# Patient Record
Sex: Male | Born: 1950 | Race: White | Hispanic: No | Smoking: Current every day smoker
Health system: Southern US, Community
[De-identification: ages and names within clinical notes are randomized; demographics above are authoritative.]

## PROBLEM LIST (undated history)

## (undated) DIAGNOSIS — E785 Hyperlipidemia, unspecified: Secondary | ICD-10-CM

## (undated) DIAGNOSIS — I214 Non-ST elevation (NSTEMI) myocardial infarction: Secondary | ICD-10-CM

## (undated) DIAGNOSIS — I2510 Atherosclerotic heart disease of native coronary artery without angina pectoris: Secondary | ICD-10-CM

---

## 2010-09-12 ENCOUNTER — Emergency Department (HOSPITAL_BASED_OUTPATIENT_CLINIC_OR_DEPARTMENT_OTHER)
Admission: EM | Admit: 2010-09-12 | Discharge: 2010-09-12 | Disposition: A | Discharge status: Home / Self Care | Payer: Worker's Compensation | Attending: Emergency Medicine | Admitting: Emergency Medicine

## 2010-09-12 ENCOUNTER — Emergency Department (INDEPENDENT_AMBULATORY_CARE_PROVIDER_SITE_OTHER): Payer: Worker's Compensation

## 2010-09-12 DIAGNOSIS — Y9289 Other specified places as the place of occurrence of the external cause: Secondary | ICD-10-CM | POA: Insufficient documentation

## 2010-09-12 DIAGNOSIS — M25579 Pain in unspecified ankle and joints of unspecified foot: Secondary | ICD-10-CM

## 2010-09-12 DIAGNOSIS — S8990XA Unspecified injury of unspecified lower leg, initial encounter: Secondary | ICD-10-CM

## 2010-09-12 DIAGNOSIS — X58XXXA Exposure to other specified factors, initial encounter: Secondary | ICD-10-CM

## 2010-09-12 DIAGNOSIS — F17200 Nicotine dependence, unspecified, uncomplicated: Secondary | ICD-10-CM | POA: Insufficient documentation

## 2010-09-12 DIAGNOSIS — S93409A Sprain of unspecified ligament of unspecified ankle, initial encounter: Secondary | ICD-10-CM | POA: Insufficient documentation

## 2010-09-12 DIAGNOSIS — R609 Edema, unspecified: Secondary | ICD-10-CM

## 2010-09-12 DIAGNOSIS — X500XXA Overexertion from strenuous movement or load, initial encounter: Secondary | ICD-10-CM | POA: Insufficient documentation

## 2013-06-19 ENCOUNTER — Emergency Department (HOSPITAL_BASED_OUTPATIENT_CLINIC_OR_DEPARTMENT_OTHER): Payer: Commercial Managed Care - PPO

## 2013-06-19 ENCOUNTER — Inpatient Hospital Stay (HOSPITAL_BASED_OUTPATIENT_CLINIC_OR_DEPARTMENT_OTHER)
Admission: EM | Admit: 2013-06-19 | Discharge: 2013-06-22 | DRG: 247 | Disposition: A | Discharge status: Home / Self Care | Payer: Commercial Managed Care - PPO | Attending: Internal Medicine | Admitting: Internal Medicine

## 2013-06-19 ENCOUNTER — Encounter (HOSPITAL_BASED_OUTPATIENT_CLINIC_OR_DEPARTMENT_OTHER): Payer: Self-pay | Admitting: Emergency Medicine

## 2013-06-19 DIAGNOSIS — E669 Obesity, unspecified: Secondary | ICD-10-CM | POA: Diagnosis present

## 2013-06-19 DIAGNOSIS — Z720 Tobacco use: Secondary | ICD-10-CM | POA: Diagnosis present

## 2013-06-19 DIAGNOSIS — Z8249 Family history of ischemic heart disease and other diseases of the circulatory system: Secondary | ICD-10-CM

## 2013-06-19 DIAGNOSIS — I214 Non-ST elevation (NSTEMI) myocardial infarction: Principal | ICD-10-CM | POA: Diagnosis present

## 2013-06-19 DIAGNOSIS — Z6831 Body mass index (BMI) 31.0-31.9, adult: Secondary | ICD-10-CM

## 2013-06-19 DIAGNOSIS — I2510 Atherosclerotic heart disease of native coronary artery without angina pectoris: Secondary | ICD-10-CM | POA: Diagnosis present

## 2013-06-19 DIAGNOSIS — E785 Hyperlipidemia, unspecified: Secondary | ICD-10-CM | POA: Diagnosis present

## 2013-06-19 DIAGNOSIS — R7309 Other abnormal glucose: Secondary | ICD-10-CM | POA: Diagnosis present

## 2013-06-19 DIAGNOSIS — R079 Chest pain, unspecified: Secondary | ICD-10-CM | POA: Diagnosis present

## 2013-06-19 DIAGNOSIS — F17200 Nicotine dependence, unspecified, uncomplicated: Secondary | ICD-10-CM | POA: Diagnosis present

## 2013-06-19 HISTORY — DX: Atherosclerotic heart disease of native coronary artery without angina pectoris: I25.10

## 2013-06-19 HISTORY — DX: Hyperlipidemia, unspecified: E78.5

## 2013-06-19 HISTORY — DX: Non-ST elevation (NSTEMI) myocardial infarction: I21.4

## 2013-06-19 LAB — COMPREHENSIVE METABOLIC PANEL WITH GFR
AST: 18 U/L (ref 0–37)
CO2: 25 meq/L (ref 19–32)
Calcium: 9.6 mg/dL (ref 8.4–10.5)
Creatinine, Ser: 1.1 mg/dL (ref 0.50–1.35)
GFR calc Af Amer: 82 mL/min — ABNORMAL LOW (ref >90)
GFR calc non Af Amer: 71 mL/min — ABNORMAL LOW (ref >90)
Glucose, Bld: 114 mg/dL — ABNORMAL HIGH (ref 70–99)

## 2013-06-19 LAB — COMPREHENSIVE METABOLIC PANEL
ALT: 22 U/L (ref 0–53)
Albumin: 3.8 g/dL (ref 3.5–5.2)
Alkaline Phosphatase: 88 U/L (ref 39–117)
BUN: 14 mg/dL (ref 6–23)
Chloride: 101 mEq/L (ref 96–112)
Potassium: 3.8 mEq/L (ref 3.7–5.3)
Sodium: 140 mEq/L (ref 137–147)
Total Bilirubin: 0.3 mg/dL (ref 0.3–1.2)
Total Protein: 7.6 g/dL (ref 6.0–8.3)

## 2013-06-19 LAB — CBC
HCT: 43.8 % (ref 39.0–52.0)
HEMATOCRIT: 44.1 % (ref 39.0–52.0)
Hemoglobin: 15.1 g/dL (ref 13.0–17.0)
Hemoglobin: 15.4 g/dL (ref 13.0–17.0)
MCH: 33.3 pg (ref 26.0–34.0)
MCH: 33.3 pg (ref 26.0–34.0)
MCHC: 34.5 g/dL (ref 30.0–36.0)
MCHC: 34.9 g/dL (ref 30.0–36.0)
MCV: 96.5 fL (ref 78.0–100.0)
MCV: 95.5 fL (ref 78.0–100.0)
Platelets: 212 K/uL (ref 150–400)
Platelets: 208 10*3/uL (ref 150–400)
RBC: 4.54 MIL/uL (ref 4.22–5.81)
RBC: 4.62 MIL/uL (ref 4.22–5.81)
RDW: 13.4 % (ref 11.5–15.5)
RDW: 13.6 % (ref 11.5–15.5)
WBC: 7.6 10*3/uL (ref 4.0–10.5)
WBC: 7.8 10*3/uL (ref 4.0–10.5)

## 2013-06-19 LAB — CREATININE, SERUM: Creatinine, Ser: 0.88 mg/dL (ref 0.50–1.35)

## 2013-06-19 LAB — TROPONIN I
Troponin I: <0.3 ng/mL (ref <0.30)
Troponin I: 0.79 ng/mL (ref <0.30)

## 2013-06-19 LAB — PRO B NATRIURETIC PEPTIDE: PRO B NATRI PEPTIDE: 15.8 pg/mL (ref 0–125)

## 2013-06-19 MED ORDER — ACETAMINOPHEN 325 MG PO TABS
650.0000 mg | ORAL_TABLET | ORAL | Status: DC | PRN
  Administered 2013-06-19 – 2013-06-21 (×4): 650 mg via ORAL
  Filled 2013-06-19 (×3): qty 2

## 2013-06-19 MED ORDER — ENOXAPARIN SODIUM 40 MG/0.4ML ~~LOC~~ SOLN
40.0000 mg | SUBCUTANEOUS | Status: DC
  Administered 2013-06-19: 40 mg via SUBCUTANEOUS
  Filled 2013-06-19 (×2): qty 0.4

## 2013-06-19 MED ORDER — ASPIRIN 81 MG PO CHEW
324.0000 mg | CHEWABLE_TABLET | Freq: Once | ORAL | Status: AC
  Administered 2013-06-19: 324 mg via ORAL
  Filled 2013-06-19: qty 4

## 2013-06-19 MED ORDER — ONDANSETRON HCL 4 MG/2ML IJ SOLN
4.0000 mg | Freq: Four times a day (QID) | INTRAMUSCULAR | Status: DC | PRN
Start: 2013-06-19 — End: 2013-06-22

## 2013-06-19 MED ORDER — EXERCISE FOR HEART AND HEALTH BOOK
Freq: Once | Status: AC
  Administered 2013-06-20: 12:00:00
  Filled 2013-06-19 (×2): qty 1

## 2013-06-19 MED ORDER — NITROGLYCERIN 0.4 MG SL SUBL
0.4000 mg | SUBLINGUAL_TABLET | SUBLINGUAL | Status: DC | PRN
  Administered 2013-06-19: 0.4 mg via SUBLINGUAL
  Filled 2013-06-19: qty 25
  Filled 2013-06-19: qty 1

## 2013-06-19 MED ORDER — MORPHINE SULFATE 2 MG/ML IJ SOLN: 2.0000 mg | INTRAMUSCULAR | Status: DC | PRN

## 2013-06-19 MED ORDER — ACTIVE PARTNERSHIP FOR HEALTH OF YOUR HEART BOOK
Freq: Once | Status: AC
  Administered 2013-06-20: 06:00:00
  Filled 2013-06-19: qty 1

## 2013-06-19 NOTE — ED Notes (Signed)
Onset of midsternal sharp chest pain at 1230 today that radiated down his left arm.  Was working under a trailer and when he crawled out from under it the pain began.  Chest pain has resolved but discomfort remains in his left arm.  States his left arm is 'aching.'  Denies SHOB, nausea, diaphoresis.

## 2013-06-19 NOTE — Progress Notes (Signed)
Called by Dr. Oletta Lamas, for transfer and admission of this 63 year old male with a history of untreated hypercholesterolemia, tobacco abuse, family history of CAD who presented to med center [place] with chest pain that radiated down his left arm. Chest pain has now completely resolved. EKG and initial troponins are negative. Vitals, labs stable.Patient has been accepted as observation to a telemetry unit, will need further orders and possible risk stratification while inpatient.

## 2013-06-19 NOTE — H&P (Signed)
[facility] History and Physical  Mark Potts IPJ:[id number] DOB: 12/06/1950 DOA: 06/19/2013  Referring physician: Quita Potts, ER physician PCP: Patient has no primary care provider  Chief Complaint: Chest discomfort  HPI: Mark Potts is a 63 y.o. male  With past medical history of hyperlipidemia and tobacco abuse who does not follow with a physician normally and was today working underneath history or when he started experiencing some chest discomfort. It was described as sharp and just left of midsternal radiating down his left arm to his elbow. He did not go anywhere else. It was described as continuous. No associated shortness of breath, diaphoresis, and nausea or lightheadedness. No worsening with deep inspiration. Patient came to the emergency room and already the pain started to diminish. After about 30 minutes in the emergency room, the pain had completely left his chest and is present as a nagging sensation almost completely in his left elbow.  In the emergency room, lab work was unremarkable. Cardiac enzymes were normal. Chest x-ray showed questionable cardiomegaly with mild pulmonary vascular congestion. On admission, patient was noted to have elevated blood pressures and mild tachycardia with medication given for pain, this resolved. Hospitalists were called for evaluation and patient was transferred to [facility] admission   Review of Systems:  Patient seen after arrival to floor. Doing okay. Complains of some residual ache in his left elbow. Denies any headaches, vision changes, dysphagia, chest pain, palpitations, shortness of breath, wheeze, cough, abdominal pain, hematuria, dysuria, constipation, diarrhea, focal extremity numbness weakness or pain. Review of systems otherwise negative.  Past Medical History  Diagnosis Date  . Hyperlipemia    tobacco abuse  History reviewed. No pertinent past surgical history.  Social History:  reports that he has been  smoking. He smokes about one pack per day He does not have any smokeless tobacco history on file. He reports that he does not drink alcohol or use illicit drugs.  No Known Allergies  Family history: Says his dad had hardening of the arteries  Prior to Admission medications   Medication Sig Start Date End Date Taking? Authorizing Provider  ibuprofen (ADVIL,MOTRIN) 200 MG tablet Take 200 mg by mouth every 6 (six) hours as needed for moderate pain.   Yes Historical Provider, MD  Nutritional Supplements (EQUATE PO) Take 1 tablet by mouth daily as needed. Tooth pain   Yes Historical Provider, MD   Physical Exam: Filed Vitals:   06/19/13 1506  BP: 139/86  Pulse: 92  Temp:   Resp: 11    BP 139/86  Pulse 92  Temp(Src) 97.8 F (36.6 C) (Oral)  Resp 11  Ht 5\' 6"  (1.676 m)  Wt 86.183 kg (190 lb)  BMI 30.68 kg/m2  SpO2 96%  General:  Appears calm and comfortable, no acute distress Eyes: Sclera nonicteric, extraocular movements are intact ENT: Normocephalic, atraumatic, mucous membranes are dry Neck: Supple, no JVD Cardiovascular: Regular rate and rhythm, S1-S2, no murmurs Respiratory: Clear to auscultation bilaterally Abdomen: Soft, nontender, nondistended, positive bowel sounds Skin: Signs of chronic sun damage, but no skin breaks tears or lesions Musculoskeletal: No clubbing or cyanosis or edema Psychiatric: Patient is appropriate, no evidence of psychoses Neurologic: No overt deficits           Labs on Admission:  Basic Metabolic Panel:  Recent Labs Lab 06/19/13 1330  NA 140  K 3.8  CL 101  CO2 25  GLUCOSE 114*  BUN 14  CREATININE 1.10  CALCIUM 9.6   Liver Function  Tests:  Recent Labs Lab 06/19/13 1330  AST 18  ALT 22  ALKPHOS 88  BILITOT 0.3  PROT 7.6  ALBUMIN 3.8   No results found for this basename: LIPASE, AMYLASE,  in the last 168 hours No results found for this basename: AMMONIA,  in the last 168 hours CBC:  Recent Labs Lab 06/19/13 1330  WBC  7.6  HGB 15.1  HCT 43.8  MCV 96.5  PLT 212   Cardiac Enzymes:  Recent Labs Lab 06/19/13 1330  TROPONINI <0.30    BNP (last 3 results) No results found for this basename: PROBNP,  in the last 8760 hours CBG: No results found for this basename: GLUCAP,  in the last 168 hours  Radiological Exams on Admission: Dg Chest Portable 1 View  06/19/2013   CLINICAL DATA:  Chest pain  EXAM: PORTABLE CHEST - 1 VIEW  COMPARISON:  None.  FINDINGS: Heart size appears borderline enlarged for portable technique. Heart size best attack assessed with two-view technique. There is mild pulmonary vascular congestion. Negative for edema or focal consolidation. The trachea is midline. Negative for pneumothorax or visible pleural effusion. No acute bony abnormality.  IMPRESSION: Cannot exclude cardiomegaly. Mild pulmonary vascular congestion is present.   Electronically Signed   By: Mark MccreedySusan  Potts M.D.   On: 06/19/2013 14:10    EKG: Independently reviewed. Normal sinus rhythm  Assessment/Plan Active Problems:   Chest pain: Patient has heart score of 3 with 3 risk factors-tobacco, obesity and hyperlipidemia, plus age plus   Obesity, unspecified: Patient is criteria with BMI of 31   Tobacco abuse: Counseled patient. He declined nicotine patch.    Other and unspecified hyperlipidemia: Patient reports history many years ago of increased cholesterol. Was not on any cholesterol medication because he cannot afford it. Recheck lipid panel in the morning and if LDL elevated and likely start on statin now that they are cheap  ? Cardiomegaly: Questionable seen on x-ray. Given pulmonary vascular congestion, will check BNP   Code Status: Full code  Family Communication: Left message with brother  Disposition Plan: If enzymes are negative, likely home tomorrow  Time spent: 35 minutes  Mark Potts [facility] Pager [phone]

## 2013-06-19 NOTE — Plan of Care (Signed)
Problem: Phase I Progression Outcomes Goal: Aspirin unless contraindicated Outcome: Completed/Met Date Met:  06/19/13 Given at [facility] center prior to transport per report from RN

## 2013-06-19 NOTE — ED Provider Notes (Signed)
CSN: [id number]     Arrival date & time 06/19/13  1312 History   First MD Initiated Contact with Patient 06/19/13 1353     Chief Complaint  Patient presents with  . Chest Pain     (Consider location/radiation/quality/duration/timing/severity/associated sxs/prior Treatment) Patient is a 63 y.o. male presenting with chest pain. The history is provided by the patient. No language interpreter was used.  Chest Pain Pain location:  L chest Pain quality: aching   Associated symptoms: no abdominal pain, no fever, no nausea, no shortness of breath and no weakness   Associated symptoms comment:  He arrives in the ED with complaint of chest pain and left upper arm aching. The symptoms started this afternoon around 12:30.Chest discomfort lasted around 15 minutes and resolved, with left arm aching persistent without change. No alleviating or aggravating factors. No nausea, vomiting, SOB. He denies history of heart disease.    Past Medical History  Diagnosis Date  . Hyperlipemia    History reviewed. No pertinent past surgical history. No family history on file. History  Substance Use Topics  . Smoking status: Current Every Day Smoker  . Smokeless tobacco: Not on file  . Alcohol Use: No    Review of Systems  Constitutional: Negative for fever and chills.  HENT: Negative.   Respiratory: Negative.  Negative for shortness of breath.   Cardiovascular: Positive for chest pain.  Gastrointestinal: Negative.  Negative for nausea and abdominal pain.  Genitourinary: Negative.   Musculoskeletal:       See HPI.  Skin: Negative.   Neurological: Negative.  Negative for weakness.      Allergies  Review of patient's allergies indicates no known allergies.  Home Medications  No current outpatient prescriptions on file. BP 184/92  Pulse 98  Temp(Src) 97.8 F (36.6 C) (Oral)  Resp 16  Ht 5\' 6"  (1.676 m)  Wt 190 lb (86.183 kg)  BMI 30.68 kg/m2  SpO2 96% Physical Exam  Constitutional: He is  oriented to person, place, and time. He appears well-developed and well-nourished.  HENT:  Head: Normocephalic.  Neck: Normal range of motion. Neck supple.  Cardiovascular: Normal rate and regular rhythm.   Pulmonary/Chest: Effort normal and breath sounds normal.  Abdominal: Soft. Bowel sounds are normal. There is no tenderness. There is no rebound and no guarding.  Musculoskeletal: Normal range of motion. He exhibits no edema.  Neurological: He is alert and oriented to person, place, and time.  Skin: Skin is warm and dry. No rash noted.  Psychiatric: He has a normal mood and affect.    ED Course  Procedures (including critical care time) Labs Review Labs Reviewed  COMPREHENSIVE METABOLIC PANEL - Abnormal; Notable for the following:    Glucose, Bld 114 (*)    GFR calc non Af Amer 71 (*)    GFR calc Af Amer 82 (*)    All other components within normal limits  CBC  TROPONIN I   Imaging Review Dg Chest Portable 1 View  06/19/2013   CLINICAL DATA:  Chest pain  EXAM: PORTABLE CHEST - 1 VIEW  COMPARISON:  None.  FINDINGS: Heart size appears borderline enlarged for portable technique. Heart size best attack assessed with two-view technique. There is mild pulmonary vascular congestion. Negative for edema or focal consolidation. The trachea is midline. Negative for pneumothorax or visible pleural effusion. No acute bony abnormality.  IMPRESSION: Cannot exclude cardiomegaly. Mild pulmonary vascular congestion is present.   Electronically Signed   By: Vinetta BergamoSusan  Turner M.D.  On: 06/19/2013 14:10     EKG Interpretation None      MDM   Final diagnoses:  None    1. Chest pain  The patient has risk factors including untreated hyperlipidemia, smoker, family history, and has symptoms concerning for ACS. Neg acute finding on EKG, neg troponin. Feel he would benefit from admission given clinical picture and risk setting. Patient transferred to [facility] after being accepted by Hospitalist.      Arnoldo Hooker, PA-C 06/20/13 1844

## 2013-06-19 NOTE — ED Provider Notes (Signed)
Medical screening examination/treatment/procedure(s) were conducted as a shared visit with non-physician practitioner(s) and myself.  I personally evaluated the patient during the encounter.   EKG Interpretation Rate 103, RAD, normal intervals.  Borderline ECG   With mild exertion today, developed left UE pain, throbbing with radiation into left chest.  CP is improved, stlll some residual LUE pain.  ASA and NTG given, ECG shows mild tachycardia, no ischemia.  Pt has no PE risks, no lower ext swelling, neg Homan's.  Initial troponin is neg.  Will place in CP observation with [facility], discussed with Dr. Jerral Ralph.          Gavin Pound. Natalie Mceuen, MD 06/19/13 1505

## 2013-06-20 ENCOUNTER — Encounter (HOSPITAL_COMMUNITY): Payer: Self-pay | Admitting: Cardiology

## 2013-06-20 DIAGNOSIS — F17200 Nicotine dependence, unspecified, uncomplicated: Secondary | ICD-10-CM

## 2013-06-20 DIAGNOSIS — I214 Non-ST elevation (NSTEMI) myocardial infarction: Secondary | ICD-10-CM | POA: Diagnosis present

## 2013-06-20 DIAGNOSIS — E785 Hyperlipidemia, unspecified: Secondary | ICD-10-CM

## 2013-06-20 DIAGNOSIS — R079 Chest pain, unspecified: Secondary | ICD-10-CM

## 2013-06-20 LAB — TROPONIN I
TROPONIN I: 2.73 ng/mL — AB (ref <0.30)
Troponin I: 5.65 ng/mL (ref <0.30)
Troponin I: 6.52 ng/mL (ref <0.30)

## 2013-06-20 LAB — CBC
HCT: 44.2 % (ref 39.0–52.0)
HEMOGLOBIN: 15.5 g/dL (ref 13.0–17.0)
MCH: 33.4 pg (ref 26.0–34.0)
MCHC: 35.1 g/dL (ref 30.0–36.0)
MCV: 95.3 fL (ref 78.0–100.0)
PLATELETS: 224 10*3/uL (ref 150–400)
RBC: 4.64 MIL/uL (ref 4.22–5.81)
RDW: 13.5 % (ref 11.5–15.5)
WBC: 8.6 10*3/uL (ref 4.0–10.5)

## 2013-06-20 LAB — LIPID PANEL
CHOL/HDL RATIO: 5.8 ratio
Cholesterol: 208 mg/dL — ABNORMAL HIGH (ref 0–200)
HDL: 36 mg/dL — AB (ref >39)
LDL Cholesterol: 133 mg/dL — ABNORMAL HIGH (ref 0–99)
TRIGLYCERIDES: 196 mg/dL — AB (ref <150)
VLDL: 39 mg/dL (ref 0–40)

## 2013-06-20 LAB — RAPID URINE DRUG SCREEN, HOSP PERFORMED
AMPHETAMINES: NOT DETECTED
BARBITURATES: NOT DETECTED
BENZODIAZEPINES: NOT DETECTED
Cocaine: NOT DETECTED
Opiates: NOT DETECTED
TETRAHYDROCANNABINOL: NOT DETECTED

## 2013-06-20 LAB — HEPARIN LEVEL (UNFRACTIONATED): Heparin Unfractionated: 0.16 IU/mL — ABNORMAL LOW (ref 0.30–0.70)

## 2013-06-20 MED ORDER — METOPROLOL TARTRATE 25 MG PO TABS
25.0000 mg | ORAL_TABLET | Freq: Two times a day (BID) | ORAL | Status: DC
  Administered 2013-06-20 – 2013-06-22 (×5): 25 mg via ORAL
  Filled 2013-06-20 (×7): qty 1

## 2013-06-20 MED ORDER — SODIUM CHLORIDE 0.9 % IJ SOLN
3.0000 mL | Freq: Two times a day (BID) | INTRAMUSCULAR | Status: DC
  Administered 2013-06-20 – 2013-06-21 (×2): 3 mL via INTRAVENOUS

## 2013-06-20 MED ORDER — ATORVASTATIN CALCIUM 80 MG PO TABS
80.0000 mg | ORAL_TABLET | Freq: Every day | ORAL | Status: DC
  Administered 2013-06-20 – 2013-06-21 (×2): 80 mg via ORAL
  Filled 2013-06-20 (×3): qty 1

## 2013-06-20 MED ORDER — HEPARIN BOLUS VIA INFUSION
4000.0000 [IU] | Freq: Once | INTRAVENOUS | Status: AC
  Administered 2013-06-20: 4000 [IU] via INTRAVENOUS
  Filled 2013-06-20: qty 4000

## 2013-06-20 MED ORDER — SODIUM CHLORIDE 0.9 % IV SOLN
1.0000 mL/kg/h | INTRAVENOUS | Status: DC
  Administered 2013-06-20 – 2013-06-21 (×2): 1 mL/kg/h via INTRAVENOUS

## 2013-06-20 MED ORDER — SODIUM CHLORIDE 0.9 % IJ SOLN: 3.0000 mL | INTRAMUSCULAR | Status: DC | PRN

## 2013-06-20 MED ORDER — NICOTINE 21 MG/24HR TD PT24
21.0000 mg | MEDICATED_PATCH | Freq: Every day | TRANSDERMAL | Status: DC
  Administered 2013-06-21 – 2013-06-22 (×2): 21 mg via TRANSDERMAL
  Filled 2013-06-20 (×4): qty 1

## 2013-06-20 MED ORDER — HEPARIN (PORCINE) IN NACL 100-0.45 UNIT/ML-% IJ SOLN
1500.0000 [IU]/h | INTRAMUSCULAR | Status: DC
  Administered 2013-06-20: 1000 [IU]/h via INTRAVENOUS
  Administered 2013-06-21: 1500 [IU]/h via INTRAVENOUS
  Filled 2013-06-20 (×3): qty 250

## 2013-06-20 MED ORDER — SODIUM CHLORIDE 0.9 % IV SOLN: 250.0000 mL | INTRAVENOUS | Status: DC | PRN

## 2013-06-20 NOTE — Consult Note (Signed)
HPI: 63 year old male with no prior cardiac history for evaluation of non-ST elevation myocardial infarction. The patient occasionally has some dyspnea on exertion which he attributes to tobacco abuse. He denies orthopnea, PND, pedal edema, claudication or exertional chest pain. Yesterday after climbing out from under a trailer he developed sudden onset of left upper extremity pain that subsequently radiated to his chest. It was described as a dull pain. Not pleuritic, positional. No associated symptoms. Lasted 30 minutes and resolved. He was admitted by the hospitalist and his enzymes have returned positive. Cardiology asked to evaluate. No recent travel or leg injury.  Medications Prior to Admission  Medication Sig Dispense Refill  . ibuprofen (ADVIL,MOTRIN) 200 MG tablet Take 200 mg by mouth every 6 (six) hours as needed for moderate pain.      . Nutritional Supplements (EQUATE PO) Take 1 tablet by mouth daily as needed. Tooth pain        No Known Allergies  Past Medical History  Diagnosis Date  . Hyperlipemia     History reviewed. No pertinent past surgical history.  History   Social History  . Marital Status: Divorced    Spouse Name: N/A    Number of Children: 2  . Years of Education: N/A   Occupational History  .  [employer]   Social History Main Topics  . Smoking status: Current Every Day Smoker  . Smokeless tobacco: Not on file  . Alcohol Use: No  . Drug Use: No  . Sexual Activity: Not on file   Other Topics Concern  . Not on file   Social History Narrative  . No narrative on file    Family History  Problem Relation Age of Onset  . Heart disease      No family history  . Cancer Brother     ROS:  Some arthralgias but no fevers or chills, productive cough, hemoptysis, dysphasia, odynophagia, melena, hematochezia, dysuria, hematuria, rash, seizure activity, orthopnea, PND, pedal edema, claudication. Remaining systems are  negative.  Physical Exam:   Blood pressure 146/80, pulse 97, temperature 98 F (36.7 C), temperature source Oral, resp. rate 16, height _0  (1.676 m), weight 190 lb (86.183 kg), SpO2 98.00%.  General:  Well developed/well nourished in NAD Skin warm/dry Patient not depressed No peripheral clubbing Back-normal HEENT-normal/normal eyelids Neck supple/normal carotid upstroke bilaterally; no bruits; no JVD; no thyromegaly chest - CTA/ normal expansion CV - RRR/normal S1 and S2; no murmurs, rubs or gallops;  PMI nondisplaced Abdomen -NT/ND, no HSM, no mass, + bowel sounds, no bruit 2+ femoral pulses, no bruits Ext-no edema, chords, 2+ DP Neuro-grossly nonfocal  ECG Sinus rhythm with nonspecific ST changes.  Results for orders placed during the hospital encounter of 06/19/13 (from the past 48 hour(s))  CBC     Status: None   Collection Time    06/19/13  1:30 PM      Result Value Ref Range   WBC 7.6  4.0 - 10.5 K/uL   RBC 4.54  4.22 - 5.81 MIL/uL   Hemoglobin 15.1  13.0 - 17.0 g/dL   HCT 43.8  39.0 - 52.0 %   MCV 96.5  78.0 - 100.0 fL   MCH 33.3  26.0 - 34.0 pg   MCHC 34.5  30.0 - 36.0 g/dL   RDW 13.4  11.5 - 15.5 %   Platelets 212  150 - 400 K/uL  COMPREHENSIVE METABOLIC PANEL     Status: Abnormal   Collection  Time    06/19/13  1:30 PM      Result Value Ref Range   Sodium 140  137 - 147 mEq/L   Potassium 3.8  3.7 - 5.3 mEq/L   Chloride 101  96 - 112 mEq/L   CO2 25  19 - 32 mEq/L   Glucose, Bld 114 (*) 70 - 99 mg/dL   BUN 14  6 - 23 mg/dL   Creatinine, Ser 1.10  0.50 - 1.35 mg/dL   Calcium 9.6  8.4 - 10.5 mg/dL   Total Protein 7.6  6.0 - 8.3 g/dL   Albumin 3.8  3.5 - 5.2 g/dL   AST 18  0 - 37 U/L   ALT 22  0 - 53 U/L   Alkaline Phosphatase 88  39 - 117 U/L   Total Bilirubin 0.3  0.3 - 1.2 mg/dL   GFR calc non Af Amer 71 (*) >90 mL/min   GFR calc Af Amer 82 (*) >90 mL/min   Comment: (NOTE)     The eGFR has been calculated using the CKD EPI equation.     This  calculation has not been validated in all clinical situations.     eGFR's persistently <90 mL/min signify possible Chronic Kidney     Disease.  TROPONIN I     Status: None   Collection Time    06/19/13  1:30 PM      Result Value Ref Range   Troponin I <0.30  <0.30 ng/mL   Comment:            Due to the release kinetics of cTnI,     a negative result within the first hours     of the onset of symptoms does not rule out     myocardial infarction with certainty.     If myocardial infarction is still suspected,     repeat the test at appropriate intervals.  TROPONIN I     Status: Abnormal   Collection Time    06/19/13  8:35 PM      Result Value Ref Range   Troponin I 0.79 (*) <0.30 ng/mL   Comment:            Due to the release kinetics of cTnI,     a negative result within the first hours     of the onset of symptoms does not rule out     myocardial infarction with certainty.     If myocardial infarction is still suspected,     repeat the test at appropriate intervals.     CRITICAL RESULT CALLED TO, READ BACK BY AND VERIFIED WITH:     Alfredia Ferguson (RN) [id number] 06/19/2013 L. LOMAX  CBC     Status: None   Collection Time    06/19/13  8:35 PM      Result Value Ref Range   WBC 7.8  4.0 - 10.5 K/uL   RBC 4.62  4.22 - 5.81 MIL/uL   Hemoglobin 15.4  13.0 - 17.0 g/dL   HCT 44.1  39.0 - 52.0 %   MCV 95.5  78.0 - 100.0 fL   MCH 33.3  26.0 - 34.0 pg   MCHC 34.9  30.0 - 36.0 g/dL   RDW 13.6  11.5 - 15.5 %   Platelets 208  150 - 400 K/uL  CREATININE, SERUM     Status: None   Collection Time    06/19/13  8:35 PM      Result Value  Ref Range   Creatinine, Ser 0.88  0.50 - 1.35 mg/dL   GFR calc non Af Amer >90  >90 mL/min   GFR calc Af Amer >90  >90 mL/min   Comment: (NOTE)     The eGFR has been calculated using the CKD EPI equation.     This calculation has not been validated in all clinical situations.     eGFR's persistently <90 mL/min signify possible Chronic Kidney     Disease.  PRO  B NATRIURETIC PEPTIDE     Status: None   Collection Time    06/19/13  8:35 PM      Result Value Ref Range   Pro B Natriuretic peptide (BNP) 15.8  0 - 125 pg/mL  TROPONIN I     Status: Abnormal   Collection Time    06/20/13 12:01 AM      Result Value Ref Range   Troponin I 2.73 (*) <0.30 ng/mL   Comment:            Due to the release kinetics of cTnI,     a negative result within the first hours     of the onset of symptoms does not rule out     myocardial infarction with certainty.     If myocardial infarction is still suspected,     repeat the test at appropriate intervals.     CRITICAL VALUE NOTED.  VALUE IS CONSISTENT WITH PREVIOUSLY REPORTED AND CALLED VALUE.  LIPID PANEL     Status: Abnormal   Collection Time    06/20/13 12:01 AM      Result Value Ref Range   Cholesterol 208 (*) 0 - 200 mg/dL   Triglycerides 196 (*) <150 mg/dL   HDL 36 (*) >39 mg/dL   Total CHOL/HDL Ratio 5.8     VLDL 39  0 - 40 mg/dL   LDL Cholesterol 133 (*) 0 - 99 mg/dL   Comment:            Total Cholesterol/HDL:CHD Risk     Coronary Heart Disease Risk Table                         Men   Women      1/2 Average Risk   3.4   3.3      Average Risk       5.0   4.4      2 X Average Risk   9.6   7.1      3 X Average Risk  23.4   11.0                Use the calculated Patient Ratio     above and the CHD Risk Table     to determine the patient's CHD Risk.                ATP III CLASSIFICATION (LDL):      <100     mg/dL   Optimal      100-129  mg/dL   Near or Above                        Optimal      130-159  mg/dL   Borderline      160-189  mg/dL   High      >190     mg/dL   Very High  TROPONIN I     Status:  Abnormal   Collection Time    06/20/13  6:00 AM      Result Value Ref Range   Troponin I 5.65 (*) <0.30 ng/mL   Comment:            Due to the release kinetics of cTnI,     a negative result within the first hours     of the onset of symptoms does not rule out     myocardial infarction  with certainty.     If myocardial infarction is still suspected,     repeat the test at appropriate intervals.     CRITICAL VALUE NOTED.  VALUE IS CONSISTENT WITH PREVIOUSLY REPORTED AND CALLED VALUE.  CBC     Status: Abnormal   Collection Time    06/20/13  3:00 PM      Result Value Ref Range   WBC 14.6 (*) 4.0 - 10.5 K/uL   Comment: WHITE COUNT CONFIRMED ON SMEAR   RBC 3.94 (*) 4.22 - 5.81 MIL/uL   Hemoglobin 11.9 (*) 13.0 - 17.0 g/dL   Comment: DELTA CHECK NOTED     REPEATED TO VERIFY   HCT 36.5 (*) 39.0 - 52.0 %   MCV 92.6  78.0 - 100.0 fL   MCH 30.2  26.0 - 34.0 pg   MCHC 32.6  30.0 - 36.0 g/dL   RDW 15.3  11.5 - 15.5 %   Platelets 234  150 - 400 K/uL   Comment: PLATELET COUNT CONFIRMED BY SMEAR    Dg Chest Portable 1 View  06/19/2013   CLINICAL DATA:  Chest pain  EXAM: PORTABLE CHEST - 1 VIEW  COMPARISON:  None.  FINDINGS: Heart size appears borderline enlarged for portable technique. Heart size best attack assessed with two-view technique. There is mild pulmonary vascular congestion. Negative for edema or focal consolidation. The trachea is midline. Negative for pneumothorax or visible pleural effusion. No acute bony abnormality.  IMPRESSION: Cannot exclude cardiomegaly. Mild pulmonary vascular congestion is present.   Electronically Signed   By: Curlene Dolphin M.D.   On: 06/19/2013 14:10    Assessment/Plan 1 non-ST elevation myocardial infarction-the patient has ruled in. He is pain-free. Continue aspirin, heparin, statin and beta blocker. Plan to proceed with cardiac catheterization tomorrow morning. The risks and benefits were discussed and the patient agrees to proceed. 2 hyperlipidemia-add statin. He will require lipids and liver in 6 weeks. 3 tobacco abuse-taking counseled on discontinuing. 4 hyperglycemia-glucose mildly elevated on admission laboratories. Check hemoglobin A1c.  Kirk Ruths MD 06/20/2013, 1:04 PM

## 2013-06-20 NOTE — Progress Notes (Signed)
Pts troponin result is 2.73. Pt denies chest pain. Daphane Shepherd, NP notified. No new orders. Cont to monitor.

## 2013-06-20 NOTE — Progress Notes (Addendum)
[facility] PROGRESS NOTE  Criss AlvineKenneth Bracewell XLK:[id number]:[id number] DOB: 1950-06-24 DOA: 06/19/2013 PCP: No primary provider on file.  Assessment/Plan: NSTEMI Risk factors include HL, heavy smoking and family hx of CAD.  Admission EKG and troponin unremarkable. Subsequent troponin elevated ( 0.79>>2.56>>5.76). On full dose ASA, s/l nitrate. Add BB , lipitor.. Started heparin drip. counseled on smoking cessation, diet changes, medication adherence and lifestyle modifications.  Monitor serial troponins. Defer 2D echo to cardiology.  Hyperlipidemia  add lipitor. LDL of 133.    Tobacco abuse Counseled on cessation. Add nicotine patch   Code Status: full code Family Communication: none at bedside Disposition Plan: home pending completion of w/up   Consultants:  cardiology  Procedures:  2D echo. Possible cardiac cath  Antibiotics:  none  HPI/Subjective: Patient denies chest pain since admission. Noted subsequent elevated troponin  Objective: Filed Vitals:   06/20/13 0841  BP: 144/90  Pulse: 85  Temp: 98 F (36.7 C)  Resp: 16    Intake/Output Summary (Last 24 hours) at 06/20/13 1023 Last data filed at 06/20/13 0600  Gross per 24 hour  Intake    480 ml  Output      0 ml  Net    480 ml   Filed Weights   06/19/13 1347  Weight: 86.183 kg (190 lb)    Exam:   General:  Middle aged male in NAD  HEENT: no pallor, moist mucosa  Chest: clear b/l, no added sounds  CVS: N S1&S2, no murmurs, rubs or gallop  Abd: soft, NT, ND, BS+  EXT: warm, no edema  CNS: AAOX3     Data Reviewed: Basic Metabolic Panel:  Recent Labs Lab 06/19/13 1330 06/19/13 2035  NA 140  --   K 3.8  --   CL 101  --   CO2 25  --   GLUCOSE 114*  --   BUN 14  --   CREATININE 1.10 0.88  CALCIUM 9.6  --    Liver Function Tests:  Recent Labs Lab 06/19/13 1330  AST 18  ALT 22  ALKPHOS 88  BILITOT 0.3  PROT 7.6  ALBUMIN 3.8   No results found for this basename: LIPASE,  AMYLASE,  in the last 168 hours No results found for this basename: AMMONIA,  in the last 168 hours CBC:  Recent Labs Lab 06/19/13 1330 06/19/13 2035  WBC 7.6 7.8  HGB 15.1 15.4  HCT 43.8 44.1  MCV 96.5 95.5  PLT 212 208   Cardiac Enzymes:  Recent Labs Lab 06/19/13 1330 06/19/13 2035 06/20/13 0001 06/20/13 0600  TROPONINI <0.30 0.79* 2.73* 5.65*   BNP (last 3 results)  Recent Labs  06/19/13 2035  PROBNP 15.8   CBG: No results found for this basename: GLUCAP,  in the last 168 hours  No results found for this or any previous visit (from the past 240 hour(s)).   Studies: Dg Chest Portable 1 View  06/19/2013   CLINICAL DATA:  Chest pain  EXAM: PORTABLE CHEST - 1 VIEW  COMPARISON:  None.  FINDINGS: Heart size appears borderline enlarged for portable technique. Heart size best attack assessed with two-view technique. There is mild pulmonary vascular congestion. Negative for edema or focal consolidation. The trachea is midline. Negative for pneumothorax or visible pleural effusion. No acute bony abnormality.  IMPRESSION: Cannot exclude cardiomegaly. Mild pulmonary vascular congestion is present.   Electronically Signed   By: Britta MccreedySusan  Turner M.D.   On: 06/19/2013 14:10    Scheduled Meds: . atorvastatin  80 mg Oral q1800  . excerise for heart and health book   Does not apply Once  . nicotine  21 mg Transdermal Daily   Continuous Infusions: . heparin 1,000 Units/hr (06/20/13 0815)      Time spent: 25 minutes    Jerelyn Trimarco  [facility] Pager [phone] If 7PM-7AM, please contact night-coverage at [email or web address], password [facility] 06/20/2013, 10:23 AM  LOS: 1 day

## 2013-06-20 NOTE — Progress Notes (Addendum)
ANTICOAGULATION CONSULT NOTE - Initial Consult  Pharmacy Consult for heparin Indication: chest pain/ACS  No Known Allergies  Patient Measurements: Height: 5\' 6"  (167.6 cm) Weight: 190 lb (86.183 kg) (pt estimated) IBW/kg (Calculated) : 63.8 Heparin Dosing Weight: 82 kg  Vital Signs: Temp: 98.2 F (36.8 C) (04/12 0500) BP: 143/87 mmHg (04/12 0500) Pulse Rate: 82 (04/12 0500)  Labs:  Recent Labs  06/19/13 1330 06/19/13 2035 06/20/13 0001 06/20/13 0600  HGB 15.1 15.4  --   --   HCT 43.8 44.1  --   --   PLT 212 208  --   --   CREATININE 1.10 0.88  --   --   TROPONINI <0.30 0.79* 2.73* 5.65*    Estimated Creatinine Clearance: 90.8 ml/min (by C-G formula based on Cr of 0.88).   Medical History: Past Medical History  Diagnosis Date  . Hyperlipemia     Medications:  See med rec  Assessment: Patient is a 63 y.o M presented to the ED with c/o CP and now with positive troponin.  To start heparin for ACS. Patient received lovenox 40mg  last night at ~2130.  Goal of Therapy:  Heparin level 0.3-0.7 units/ml Monitor platelets by anticoagulation protocol: Yes   Plan:  1) heparin 4000 units IV bolus, then drip at 1000 units/hr 2) check 6 hour heparin level 3) d/c lovenox 40mg   Anh P Pham 06/20/2013,7:59 AM  Addendum  Heparin level undetectable Increased heparin to 1250 units/hr Next HL at 2300 Daily CBC, HL   Agapito Games, PharmD, BCPS Clinical Pharmacist Pager: [phone] 06/20/2013 4:53 PM

## 2013-06-20 NOTE — Progress Notes (Signed)
Pt's second troponin was 0.79. Pt denies CP but did have some lt elbow pain earlier that was relieved by tylenol and a hot pack. Daphane Shepherd, NP notified. No new orders. Cont to monitor.

## 2013-06-20 NOTE — Progress Notes (Signed)
UR Completed.  Cathaleen Korol Jane Boykin Baetz [phone] 06/20/2013  

## 2013-06-21 ENCOUNTER — Encounter (HOSPITAL_COMMUNITY)
Admission: EM | Disposition: A | Discharge status: Home / Self Care | Payer: Commercial Managed Care - PPO | Source: Home / Self Care | Attending: Internal Medicine

## 2013-06-21 DIAGNOSIS — I214 Non-ST elevation (NSTEMI) myocardial infarction: Secondary | ICD-10-CM

## 2013-06-21 DIAGNOSIS — I2510 Atherosclerotic heart disease of native coronary artery without angina pectoris: Secondary | ICD-10-CM

## 2013-06-21 HISTORY — PX: CORONARY ANGIOPLASTY WITH STENT PLACEMENT: SHX49

## 2013-06-21 HISTORY — PX: LEFT HEART CATHETERIZATION WITH CORONARY ANGIOGRAM: SHX5451

## 2013-06-21 LAB — CBC
HCT: 43.1 % (ref 39.0–52.0)
Hemoglobin: 14.8 g/dL (ref 13.0–17.0)
MCH: 32.5 pg (ref 26.0–34.0)
MCHC: 34.3 g/dL (ref 30.0–36.0)
MCV: 94.7 fL (ref 78.0–100.0)
PLATELETS: 219 10*3/uL (ref 150–400)
RBC: 4.55 MIL/uL (ref 4.22–5.81)
RDW: 13.4 % (ref 11.5–15.5)
WBC: 7.8 10*3/uL (ref 4.0–10.5)

## 2013-06-21 LAB — HEMOGLOBIN A1C
HEMOGLOBIN A1C: 6.2 % — AB (ref <5.7)
Mean Plasma Glucose: 131 mg/dL — ABNORMAL HIGH (ref <117)

## 2013-06-21 LAB — PROTIME-INR
INR: 0.96 (ref 0.00–1.49)
PROTHROMBIN TIME: 12.6 s (ref 11.6–15.2)

## 2013-06-21 LAB — HEPARIN LEVEL (UNFRACTIONATED): Heparin Unfractionated: 0.3 IU/mL (ref 0.30–0.70)

## 2013-06-21 LAB — POCT ACTIVATED CLOTTING TIME
Activated Clotting Time: 265 seconds
Activated Clotting Time: 288 seconds

## 2013-06-21 SURGERY — LEFT HEART CATHETERIZATION WITH CORONARY ANGIOGRAM
Anesthesia: LOCAL

## 2013-06-21 MED ORDER — TIROFIBAN HCL IV 5 MG/100ML
0.1500 ug/kg/min | INTRAVENOUS | Status: AC
  Administered 2013-06-21: 0.15 ug/kg/min via INTRAVENOUS
  Filled 2013-06-21 (×3): qty 100

## 2013-06-21 MED ORDER — ASPIRIN 81 MG PO CHEW: 81.0000 mg | CHEWABLE_TABLET | Freq: Every day | ORAL | Status: DC

## 2013-06-21 MED ORDER — ASPIRIN 81 MG PO CHEW
81.0000 mg | CHEWABLE_TABLET | Freq: Every day | ORAL | Status: DC
  Administered 2013-06-22: 81 mg via ORAL
  Filled 2013-06-21: qty 1

## 2013-06-21 MED ORDER — HEART ATTACK BOUNCING BOOK
Freq: Once | Status: AC
  Administered 2013-06-21: 14:00:00
  Filled 2013-06-21: qty 1

## 2013-06-21 MED ORDER — MIDAZOLAM HCL 2 MG/2ML IJ SOLN
INTRAMUSCULAR | Status: AC
  Filled 2013-06-21: qty 2

## 2013-06-21 MED ORDER — FENTANYL CITRATE 0.05 MG/ML IJ SOLN
INTRAMUSCULAR | Status: AC
Start: 2013-06-21 — End: 2013-06-21
  Filled 2013-06-21: qty 2

## 2013-06-21 MED ORDER — SALINE SPRAY 0.65 % NA SOLN
1.0000 | NASAL | Status: DC | PRN
  Administered 2013-06-21: 1 via NASAL
  Filled 2013-06-21: qty 44

## 2013-06-21 MED ORDER — VERAPAMIL HCL 2.5 MG/ML IV SOLN
INTRAVENOUS | Status: AC
  Filled 2013-06-21: qty 2

## 2013-06-21 MED ORDER — HEPARIN (PORCINE) IN NACL 2-0.9 UNIT/ML-% IJ SOLN
INTRAMUSCULAR | Status: AC
  Filled 2013-06-21: qty 1500

## 2013-06-21 MED ORDER — HEPARIN BOLUS VIA INFUSION
2000.0000 [IU] | Freq: Once | INTRAVENOUS | Status: AC
  Administered 2013-06-21: 2000 [IU] via INTRAVENOUS
  Filled 2013-06-21: qty 2000

## 2013-06-21 MED ORDER — TIROFIBAN HCL IV 5 MG/100ML
INTRAVENOUS | Status: AC
  Filled 2013-06-21: qty 100

## 2013-06-21 MED ORDER — SODIUM CHLORIDE 0.9 % IV SOLN
1.0000 mL/kg/h | INTRAVENOUS | Status: AC
  Administered 2013-06-21: 1 mL/kg/h via INTRAVENOUS

## 2013-06-21 MED ORDER — NITROGLYCERIN 0.2 MG/ML ON CALL CATH LAB
INTRAVENOUS | Status: AC
  Filled 2013-06-21: qty 1

## 2013-06-21 MED ORDER — LIDOCAINE HCL (PF) 1 % IJ SOLN
INTRAMUSCULAR | Status: AC
  Filled 2013-06-21: qty 30

## 2013-06-21 MED ORDER — PRASUGREL HCL 10 MG PO TABS
10.0000 mg | ORAL_TABLET | Freq: Every day | ORAL | Status: DC
  Administered 2013-06-22: 09:00:00 10 mg via ORAL
  Filled 2013-06-21: qty 1

## 2013-06-21 MED ORDER — MIDAZOLAM HCL 2 MG/2ML IJ SOLN
INTRAMUSCULAR | Status: AC
Start: 2013-06-21 — End: 2013-06-21
  Filled 2013-06-21: qty 2

## 2013-06-21 MED ORDER — ASPIRIN 81 MG PO CHEW
81.0000 mg | CHEWABLE_TABLET | ORAL | Status: AC
  Administered 2013-06-21: 81 mg via ORAL
  Filled 2013-06-21: qty 1

## 2013-06-21 MED ORDER — HEPARIN SODIUM (PORCINE) 1000 UNIT/ML IJ SOLN
INTRAMUSCULAR | Status: AC
  Filled 2013-06-21: qty 1

## 2013-06-21 MED ORDER — PRASUGREL HCL 10 MG PO TABS
ORAL_TABLET | ORAL | Status: AC
  Filled 2013-06-21: qty 6

## 2013-06-21 NOTE — Progress Notes (Addendum)
Patient Name: Onecimo Purchase Date of Encounter: 06/21/2013   Principal Problem:   NSTEMI (non-ST elevated myocardial infarction) Active Problems:   Obesity, unspecified   Tobacco abuse   Other and unspecified hyperlipidemia   SUBJECTIVE  No chest pain or sob overnight.  For cath today.  CURRENT MEDS . [START ON 06/22/2013] aspirin  81 mg Oral Daily  . atorvastatin  80 mg Oral q1800  . metoprolol tartrate  25 mg Oral BID  . nicotine  21 mg Transdermal Daily  . sodium chloride  3 mL Intravenous Q12H        ASA 81mg  daily  OBJECTIVE  Filed Vitals:   06/20/13 1637 06/20/13 2000 06/21/13 0100 06/21/13 0500  BP: 126/79 144/93 125/75 128/79  Pulse: 79 74 75 80  Temp: 98 F (36.7 C) 98.3 F (36.8 C) 97.4 F (36.3 C) 98.6 F (37 C)  TempSrc: Oral     Resp: 16 18 16 16   Height:      Weight:      SpO2: 98% 98% 96% 97%    Intake/Output Summary (Last 24 hours) at 06/21/13 0723 Last data filed at 06/20/13 2100  Gross per 24 hour  Intake    360 ml  Output      0 ml  Net    360 ml   Filed Weights   06/19/13 1347  Weight: 190 lb (86.183 kg)    PHYSICAL EXAM  General: Pleasant, NAD. Neuro: Alert and oriented X 3. Moves all extremities spontaneously. Psych: Normal affect. HEENT:  Normal  Neck: Supple without bruits or JVD. Lungs:  Resp regular and unlabored, CTA. Heart: Distant, RRR no s3, s4, or murmurs. Abdomen: Soft, non-tender, non-distended, BS + x 4.  Extremities: No clubbing, cyanosis or edema. DP/PT/Radials 2+ and equal bilaterally.  Accessory Clinical Findings  CBC  Recent Labs  06/19/13 2035 06/20/13 1500  WBC 7.8 8.6  HGB 15.4 15.5  HCT 44.1 44.2  MCV 95.5 95.3  PLT 208 224   Basic Metabolic Panel  Recent Labs  06/19/13 1330 06/19/13 2035  NA 140  --   K 3.8  --   CL 101  --   CO2 25  --   GLUCOSE 114*  --   BUN 14  --   CREATININE 1.10 0.88  CALCIUM 9.6  --    Liver Function Tests  Recent Labs  06/19/13 1330  AST 18  ALT  22  ALKPHOS 88  BILITOT 0.3  PROT 7.6  ALBUMIN 3.8   Cardiac Enzymes  Recent Labs  06/20/13 0001 06/20/13 0600 06/20/13 1320  TROPONINI 2.73* 5.65* 6.52*   Fasting Lipid Panel  Recent Labs  06/20/13 0001  CHOL 208*  HDL 36*  LDLCALC 133*  TRIG 196*  CHOLHDL 5.8   TELE  rsr  Radiology/Studies  Dg Chest Portable 1 View  06/19/2013   CLINICAL DATA:  Chest pain  EXAM: PORTABLE CHEST - 1 VIEW  COMPARISON:  None.  FINDINGS: Heart size appears borderline enlarged for portable technique. Heart size best attack assessed with two-view technique. There is mild pulmonary vascular congestion. Negative for edema or focal consolidation. The trachea is midline. Negative for pneumothorax or visible pleural effusion. No acute bony abnormality.  IMPRESSION: Cannot exclude cardiomegaly. Mild pulmonary vascular congestion is present.   Electronically Signed   By: Britta Mccreedy M.D.   On: 06/19/2013 14:10   ASSESSMENT AND PLAN  1.  NSTEMI:  No further chest pain.  On for cath today.  Cont bb, statin.  Asa added.  2.  HL:  LDL 133.  On high potency statin.  LFT's nl. Will need f/u lipids/lft's in 6 wks.  3.  Tob Abuse:  Cessation advised.  Signed, Ok Anishristopher R Berge NP  The patient was seen, examined and discussed with Saralyn Pilarhris Berger, NP and agree as above. The patient with NSTEMI, troponin still uptrending, now chest pain free, cath today. Crea ok.  Lars MassonKatarina H Tlaloc Taddei 06/21/2013

## 2013-06-21 NOTE — Care Management Note (Addendum)
  Page 2 of 2   06/22/2013     3:10:59 PM   CARE MANAGEMENT NOTE 06/22/2013  Patient:  MEGA, BORGEN   Account Number:  [id number]  Date Initiated:  06/21/2013  Documentation initiated by:  Caleb Decock  Subjective/Objective Assessment:   LEFT HEART CATHETERIZATION WITH CORONARY ANGIOGRAM (N/A)     Action/Plan:   CM to follow for dispositon needs   Anticipated DC Date:  06/22/2013   Anticipated DC Plan:  HOME/SELF CARE         Choice offered to / List presented to:             Status of service:  Completed, signed off Medicare Important Message given?   (If response is "NO", the following Medicare IM given date fields will be blank) Date Medicare IM given:   Date Additional Medicare IM given:    Discharge Disposition:    Per UR Regulation:  Reviewed for med. necessity/level of care/duration of stay  If discussed at Long Length of Stay Meetings, dates discussed:    Comments:  06/22/2013 Patient d/c to home / self care Jayshon Dommer RN, BSN, MSHL< CCM 06/22/2013  Jasani Dolney RN, BSN, MSHL, CCM 06/21/2013 Effient update: PER REP AT [facility] RX:  EFFIENT 10 MG ONCE A DAY IS COVERED $35 CO-PAY AT RETAIL TWICE A DAY REQUIRES PRIOR AUTH/ PH [id number] PATIENT CAN USE ANY MAJOR RETAIL PHARMACY   ---06/21/2013 1529 by Donato Schultz--- Benefits Check: prasugrel (EFFIENT) tablet 10 mg 1x/day and 2x/day dosing cost Please check coverage, co-pays, authorizations, deductibles and pharmacy requirements. Tyffany Waldrop RN, BSN, [place], [place] 06/21/2013

## 2013-06-21 NOTE — CV Procedure (Signed)
PROCEDURE:  Left heart catheterization with selective coronary angiography, left ventriculogram.  PCI of the mid left circumflex, PCI of the OM1.  INDICATIONS:  NSTEMI  The risks, benefits, and details of the procedure were explained to the patient.  The patient verbalized understanding and wanted to proceed.  Informed written consent was obtained.  PROCEDURE TECHNIQUE:  After Xylocaine anesthesia a 29F slender sheath was placed in the right radial artery with a single anterior needle wall stick.   IV heparin was given. Right coronary angiography was done using a Judkins R4 guide catheter.  Left coronary angiography was done using a Judkins L3.5 guide catheter.  Left ventriculography was done using a pigtail catheter.  A TR band was used for hemostasis.   CONTRAST:  Total of 115 cc.  COMPLICATIONS:  None.    HEMODYNAMICS:  Aortic pressure was 113/75; LV pressure was 114/15; LVEDP 24.  There was no gradient between the left ventricle and aorta.    ANGIOGRAPHIC DATA:   The left main coronary artery is widely patent.  The left anterior descending artery is a large vessel proximally. There is mild disease in the mid vessel. The distal LAD does taper significantly. There several medium-sized diagonal vessels with moderate disease.  The left circumflex artery is a large vessel. In the proximal circumflex, there is mild, eccentric plaque. After a very small OM1, there is a hazy 70% lesion in the mid circumflex, noted particularly in the LAO cranial view. It is an eccentric stenosis. The OM1 is a large vessel. There is an area of moderate diffuse disease with a more focal area of 80% stenosis.  The right coronary artery is a large dominant vessel. The posterior lateral artery is a small vessel.  There is one long, still small in diameter, branch from this.  Before the origin, there is a 75% stenosis.  The posterior descending artery is a medium size vessel which is widely patent.  LEFT  VENTRICULOGRAM:  Left ventricular angiogram was done in the 30 RAO projection and revealed normal left ventricular wall motion and systolic function with an estimated ejection fraction of 60 %.  LVEDP was 24 mmHg.  PCI NARRATIVE: IV heparin and tirofiban were given. A 5 French EBU 3.0 guiding catheter was used to engage the left main. A pro-water wire was placed across the area disease in the mid circumflex and the OM1. A 2.0 x 15 balloon was used to predilate the most distal area disease. This was then stented with a 2.25 x 30 resolute drug-eluting stents. This was post dilated with a 2.5 x 15 balloon. After postdilatation, there is still noted to be a slightly under deployed area of the stent. We then deployed a 2.5 x 12 resolution drug-eluting stent in overlapping fashion across the proximal age, to cover the 70% mid circumflex lesion. The entire stented area was then post dilated with the stent balloon up to 2.8 mm. There is some mild disease just proximal to the stent. There appeared to be a good step up.  There was TIMI 3 flow.  IMPRESSIONS:  1. Widely patent  left main coronary artery. 2.  Widely patent  left anterior descending artery and its branches. 3.  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 4.  Widely patent right coronary artery.  Very small vessel disease in the posterior lateral artery. 5. Normal left ventricular systolic function.  LVEDP  24 mmHg.  Ejection fraction  60%.  RECOMMENDATION:  Continue dual antiplatelet therapy for at least a year. I stressed the importance of staying on his antiplatelet agents to avoid stent thrombosis. I also stressed the importance of smoking cessation.  He will f/u with Dr. Delton See.

## 2013-06-21 NOTE — Progress Notes (Signed)
UR completed. Patient changed to inpatient-  NSTEMI 

## 2013-06-21 NOTE — Progress Notes (Signed)
TR BAND REMOVAL  LOCATION:  right radial  DEFLATED PER PROTOCOL:  yes  TIME BAND OFF / DRESSING APPLIED:   1630   SITE UPON ARRIVAL:   Level 0  SITE AFTER BAND REMOVAL:  Level 0  REVERSE ALLEN'S TEST:    positive  CIRCULATION SENSATION AND MOVEMENT:  Within Normal Limits  yes  COMMENTS:    

## 2013-06-21 NOTE — Interval H&P Note (Signed)
Cath Lab Visit (complete for each Cath Lab visit)  Clinical Evaluation Leading to the Procedure:   ACS: yes  Non-ACS:    Anginal Classification: CCS IV  Anti-ischemic medical therapy: Minimal Therapy (1 class of medications)  Non-Invasive Test Results: No non-invasive testing performed  Prior CABG: No previous CABG      History and Physical Interval Note:  06/21/2013 10:07 AM  Mark Potts  has presented today for surgery, with the diagnosis of chest pain  The various methods of treatment have been discussed with the patient and family. After consideration of risks, benefits and other options for treatment, the patient has consented to  Procedure(s): LEFT HEART CATHETERIZATION WITH CORONARY ANGIOGRAM (N/A) as a surgical intervention .  The patient's history has been reviewed, patient examined, no change in status, stable for surgery.  I have reviewed the patient's chart and labs.  Questions were answered to the patient's satisfaction.     Corky Crafts

## 2013-06-21 NOTE — Progress Notes (Signed)
[facility] PROGRESS NOTE  Criss AlvineKenneth Kemmerer ZOX:[id number] DOB: 1950/09/23 DOA: 06/19/2013 PCP: No primary provider on file.  Assessment/Plan: NSTEMI Risk factors include HL, heavy smoking and family hx of CAD.  Admission EKG and troponin unremarkable. Subsequent troponin elevated ( 0.79>>2.56>>5.76). On full dose ASA, s/l nitrate. Added BB , lipitor.. Started heparin drip. cardiac cath today shows severe disease in the midleft circumflex artery and in the OM1 branch. Patent left main and LAD. EF of 60%. Needs dual antiplatelet therapy with lifelong aspirin and 12 months presugrel counseled on smoking cessation, diet adherence, medication adherence and lifestyle modifications.   Hyperlipidemia  add lipitor. LDL of 133.    Tobacco abuse Counseled on cessation. Added nicotine patch   Code Status: full code Family Communication: none at bedside Disposition Plan: home in am   Consultants:  cardiology  Procedures:  2D echo. Possible cardiac cath  Antibiotics:  none  HPI/Subjective: Patient seen post cardiac cath. Denies any symptoms  Objective: Filed Vitals:   06/21/13 1215  BP: 140/99  Pulse: 64  Temp:   Resp:     Intake/Output Summary (Last 24 hours) at 06/21/13 1613 Last data filed at 06/21/13 1244  Gross per 24 hour  Intake 1715.38 ml  Output      0 ml  Net 1715.38 ml   Filed Weights   06/19/13 1347  Weight: 86.183 kg (190 lb)    Exam:   General:  Middle aged male in NAD  HEENT: no pallor, moist mucosa  Chest: clear b/l, no added sounds  CVS: N S1&S2, no murmurs, rubs or gallop  Abd: soft, NT, ND, BS+  EXT: warm, no edema, right radial cath site normal, no bleeding  CNS: AAOX3     Data Reviewed: Basic Metabolic Panel:  Recent Labs Lab 06/19/13 1330 06/19/13 2035  NA 140  --   K 3.8  --   CL 101  --   CO2 25  --   GLUCOSE 114*  --   BUN 14  --   CREATININE 1.10 0.88  CALCIUM 9.6  --    Liver Function Tests:  Recent  Labs Lab 06/19/13 1330  AST 18  ALT 22  ALKPHOS 88  BILITOT 0.3  PROT 7.6  ALBUMIN 3.8   No results found for this basename: LIPASE, AMYLASE,  in the last 168 hours No results found for this basename: AMMONIA,  in the last 168 hours CBC:  Recent Labs Lab 06/19/13 1330 06/19/13 2035 06/20/13 1500 06/21/13 0640  WBC 7.6 7.8 8.6 7.8  HGB 15.1 15.4 15.5 14.8  HCT 43.8 44.1 44.2 43.1  MCV 96.5 95.5 95.3 94.7  PLT 212 208 224 219   Cardiac Enzymes:  Recent Labs Lab 06/19/13 1330 06/19/13 2035 06/20/13 0001 06/20/13 0600 06/20/13 1320  TROPONINI <0.30 0.79* 2.73* 5.65* 6.52*   BNP (last 3 results)  Recent Labs  06/19/13 2035  PROBNP 15.8   CBG: No results found for this basename: GLUCAP,  in the last 168 hours  No results found for this or any previous visit (from the past 240 hour(s)).   Studies: No results found.  Scheduled Meds: . [START ON 06/22/2013] aspirin  81 mg Oral Daily  . atorvastatin  80 mg Oral q1800  . metoprolol tartrate  25 mg Oral BID  . nicotine  21 mg Transdermal Daily  . [START ON 06/22/2013] prasugrel  10 mg Oral Daily   Continuous Infusions: . sodium chloride 1 mL/kg/hr (06/21/13 1200)  . tirofiban 0.15  mcg/kg/min (06/21/13 1324)      Time spent: 25 minutes    Mimi Debellis  [facility] Pager [phone] If 7PM-7AM, please contact night-coverage at [email or web address], password [facility] 06/21/2013, 4:13 PM  LOS: 2 days

## 2013-06-21 NOTE — Progress Notes (Signed)
ANTICOAGULATION CONSULT NOTE - Follow Up Consult  Pharmacy Consult for heparin Indication: NSTEMI  Labs:  Recent Labs  06/19/13 1330 06/19/13 2035 06/20/13 0001 06/20/13 0600 06/20/13 1320 06/20/13 1500 06/20/13 2300  HGB 15.1 15.4  --   --   --  15.5  --   HCT 43.8 44.1  --   --   --  44.2  --   PLT 212 208  --   --   --  224  --   HEPARINUNFRC  --   --   --   --   --  <0.10* 0.16*  CREATININE 1.10 0.88  --   --   --   --   --   TROPONINI <0.30 0.79* 2.73* 5.65* 6.52*  --   --     Assessment: 63yo male remains subtherapeutic on heparin after rate increase.  Goal of Therapy:  Heparin level 0.3-0.7 units/ml   Plan:  Will rebolus with heparin 2000 units and increase gtt by 3 units/kg/hr to 1500 units/hr and check level in 6hr.  Vernard Gambles, PharmD, BCPS  06/21/2013,12:06 AM

## 2013-06-21 NOTE — H&P (View-Only) (Signed)
HPI: 63 year old male with no prior cardiac history for evaluation of non-ST elevation myocardial infarction. The patient occasionally has some dyspnea on exertion which he attributes to tobacco abuse. He denies orthopnea, PND, pedal edema, claudication or exertional chest pain. Yesterday after climbing out from under a trailer he developed sudden onset of left upper extremity pain that subsequently radiated to his chest. It was described as a dull pain. Not pleuritic, positional. No associated symptoms. Lasted 30 minutes and resolved. He was admitted by the hospitalist and his enzymes have returned positive. Cardiology asked to evaluate. No recent travel or leg injury.  Medications Prior to Admission  Medication Sig Dispense Refill  . ibuprofen (ADVIL,MOTRIN) 200 MG tablet Take 200 mg by mouth every 6 (six) hours as needed for moderate pain.      . Nutritional Supplements (EQUATE PO) Take 1 tablet by mouth daily as needed. Tooth pain        No Known Allergies  Past Medical History  Diagnosis Date  . Hyperlipemia     History reviewed. No pertinent past surgical history.  History   Social History  . Marital Status: Divorced    Spouse Name: N/A    Number of Children: 2  . Years of Education: N/A   Occupational History  .  [employer]   Social History Main Topics  . Smoking status: Current Every Day Smoker  . Smokeless tobacco: Not on file  . Alcohol Use: No  . Drug Use: No  . Sexual Activity: Not on file   Other Topics Concern  . Not on file   Social History Narrative  . No narrative on file    Family History  Problem Relation Age of Onset  . Heart disease      No family history  . Cancer Brother     ROS:  Some arthralgias but no fevers or chills, productive cough, hemoptysis, dysphasia, odynophagia, melena, hematochezia, dysuria, hematuria, rash, seizure activity, orthopnea, PND, pedal edema, claudication. Remaining systems are  negative.  Physical Exam:   Blood pressure 146/80, pulse 97, temperature 98 F (36.7 C), temperature source Oral, resp. rate 16, height _0  (1.676 m), weight 190 lb (86.183 kg), SpO2 98.00%.  General:  Well developed/well nourished in NAD Skin warm/dry Patient not depressed No peripheral clubbing Back-normal HEENT-normal/normal eyelids Neck supple/normal carotid upstroke bilaterally; no bruits; no JVD; no thyromegaly chest - CTA/ normal expansion CV - RRR/normal S1 and S2; no murmurs, rubs or gallops;  PMI nondisplaced Abdomen -NT/ND, no HSM, no mass, + bowel sounds, no bruit 2+ femoral pulses, no bruits Ext-no edema, chords, 2+ DP Neuro-grossly nonfocal  ECG Sinus rhythm with nonspecific ST changes.  Results for orders placed during the hospital encounter of 06/19/13 (from the past 48 hour(s))  CBC     Status: None   Collection Time    06/19/13  1:30 PM      Result Value Ref Range   WBC 7.6  4.0 - 10.5 K/uL   RBC 4.54  4.22 - 5.81 MIL/uL   Hemoglobin 15.1  13.0 - 17.0 g/dL   HCT 43.8  39.0 - 52.0 %   MCV 96.5  78.0 - 100.0 fL   MCH 33.3  26.0 - 34.0 pg   MCHC 34.5  30.0 - 36.0 g/dL   RDW 13.4  11.5 - 15.5 %   Platelets 212  150 - 400 K/uL  COMPREHENSIVE METABOLIC PANEL     Status: Abnormal   Collection  Time    06/19/13  1:30 PM      Result Value Ref Range   Sodium 140  137 - 147 mEq/L   Potassium 3.8  3.7 - 5.3 mEq/L   Chloride 101  96 - 112 mEq/L   CO2 25  19 - 32 mEq/L   Glucose, Bld 114 (*) 70 - 99 mg/dL   BUN 14  6 - 23 mg/dL   Creatinine, Ser 1.10  0.50 - 1.35 mg/dL   Calcium 9.6  8.4 - 10.5 mg/dL   Total Protein 7.6  6.0 - 8.3 g/dL   Albumin 3.8  3.5 - 5.2 g/dL   AST 18  0 - 37 U/L   ALT 22  0 - 53 U/L   Alkaline Phosphatase 88  39 - 117 U/L   Total Bilirubin 0.3  0.3 - 1.2 mg/dL   GFR calc non Af Amer 71 (*) >90 mL/min   GFR calc Af Amer 82 (*) >90 mL/min   Comment: (NOTE)     The eGFR has been calculated using the CKD EPI equation.     This  calculation has not been validated in all clinical situations.     eGFR's persistently <90 mL/min signify possible Chronic Kidney     Disease.  TROPONIN I     Status: None   Collection Time    06/19/13  1:30 PM      Result Value Ref Range   Troponin I <0.30  <0.30 ng/mL   Comment:            Due to the release kinetics of cTnI,     a negative result within the first hours     of the onset of symptoms does not rule out     myocardial infarction with certainty.     If myocardial infarction is still suspected,     repeat the test at appropriate intervals.  TROPONIN I     Status: Abnormal   Collection Time    06/19/13  8:35 PM      Result Value Ref Range   Troponin I 0.79 (*) <0.30 ng/mL   Comment:            Due to the release kinetics of cTnI,     a negative result within the first hours     of the onset of symptoms does not rule out     myocardial infarction with certainty.     If myocardial infarction is still suspected,     repeat the test at appropriate intervals.     CRITICAL RESULT CALLED TO, READ BACK BY AND VERIFIED WITH:     Alfredia Ferguson (RN) [id number] 06/19/2013 L. LOMAX  CBC     Status: None   Collection Time    06/19/13  8:35 PM      Result Value Ref Range   WBC 7.8  4.0 - 10.5 K/uL   RBC 4.62  4.22 - 5.81 MIL/uL   Hemoglobin 15.4  13.0 - 17.0 g/dL   HCT 44.1  39.0 - 52.0 %   MCV 95.5  78.0 - 100.0 fL   MCH 33.3  26.0 - 34.0 pg   MCHC 34.9  30.0 - 36.0 g/dL   RDW 13.6  11.5 - 15.5 %   Platelets 208  150 - 400 K/uL  CREATININE, SERUM     Status: None   Collection Time    06/19/13  8:35 PM      Result Value  Ref Range   Creatinine, Ser 0.88  0.50 - 1.35 mg/dL   GFR calc non Af Amer >90  >90 mL/min   GFR calc Af Amer >90  >90 mL/min   Comment: (NOTE)     The eGFR has been calculated using the CKD EPI equation.     This calculation has not been validated in all clinical situations.     eGFR's persistently <90 mL/min signify possible Chronic Kidney     Disease.  PRO  B NATRIURETIC PEPTIDE     Status: None   Collection Time    06/19/13  8:35 PM      Result Value Ref Range   Pro B Natriuretic peptide (BNP) 15.8  0 - 125 pg/mL  TROPONIN I     Status: Abnormal   Collection Time    06/20/13 12:01 AM      Result Value Ref Range   Troponin I 2.73 (*) <0.30 ng/mL   Comment:            Due to the release kinetics of cTnI,     a negative result within the first hours     of the onset of symptoms does not rule out     myocardial infarction with certainty.     If myocardial infarction is still suspected,     repeat the test at appropriate intervals.     CRITICAL VALUE NOTED.  VALUE IS CONSISTENT WITH PREVIOUSLY REPORTED AND CALLED VALUE.  LIPID PANEL     Status: Abnormal   Collection Time    06/20/13 12:01 AM      Result Value Ref Range   Cholesterol 208 (*) 0 - 200 mg/dL   Triglycerides 196 (*) <150 mg/dL   HDL 36 (*) >39 mg/dL   Total CHOL/HDL Ratio 5.8     VLDL 39  0 - 40 mg/dL   LDL Cholesterol 133 (*) 0 - 99 mg/dL   Comment:            Total Cholesterol/HDL:CHD Risk     Coronary Heart Disease Risk Table                         Men   Women      1/2 Average Risk   3.4   3.3      Average Risk       5.0   4.4      2 X Average Risk   9.6   7.1      3 X Average Risk  23.4   11.0                Use the calculated Patient Ratio     above and the CHD Risk Table     to determine the patient's CHD Risk.                ATP III CLASSIFICATION (LDL):      <100     mg/dL   Optimal      100-129  mg/dL   Near or Above                        Optimal      130-159  mg/dL   Borderline      160-189  mg/dL   High      >190     mg/dL   Very High  TROPONIN I     Status:  Abnormal   Collection Time    06/20/13  6:00 AM      Result Value Ref Range   Troponin I 5.65 (*) <0.30 ng/mL   Comment:            Due to the release kinetics of cTnI,     a negative result within the first hours     of the onset of symptoms does not rule out     myocardial infarction  with certainty.     If myocardial infarction is still suspected,     repeat the test at appropriate intervals.     CRITICAL VALUE NOTED.  VALUE IS CONSISTENT WITH PREVIOUSLY REPORTED AND CALLED VALUE.  CBC     Status: Abnormal   Collection Time    06/20/13  3:00 PM      Result Value Ref Range   WBC 14.6 (*) 4.0 - 10.5 K/uL   Comment: WHITE COUNT CONFIRMED ON SMEAR   RBC 3.94 (*) 4.22 - 5.81 MIL/uL   Hemoglobin 11.9 (*) 13.0 - 17.0 g/dL   Comment: DELTA CHECK NOTED     REPEATED TO VERIFY   HCT 36.5 (*) 39.0 - 52.0 %   MCV 92.6  78.0 - 100.0 fL   MCH 30.2  26.0 - 34.0 pg   MCHC 32.6  30.0 - 36.0 g/dL   RDW 15.3  11.5 - 15.5 %   Platelets 234  150 - 400 K/uL   Comment: PLATELET COUNT CONFIRMED BY SMEAR    Dg Chest Portable 1 View  06/19/2013   CLINICAL DATA:  Chest pain  EXAM: PORTABLE CHEST - 1 VIEW  COMPARISON:  None.  FINDINGS: Heart size appears borderline enlarged for portable technique. Heart size best attack assessed with two-view technique. There is mild pulmonary vascular congestion. Negative for edema or focal consolidation. The trachea is midline. Negative for pneumothorax or visible pleural effusion. No acute bony abnormality.  IMPRESSION: Cannot exclude cardiomegaly. Mild pulmonary vascular congestion is present.   Electronically Signed   By: Curlene Dolphin M.D.   On: 06/19/2013 14:10    Assessment/Plan 1 non-ST elevation myocardial infarction-the patient has ruled in. He is pain-free. Continue aspirin, heparin, statin and beta blocker. Plan to proceed with cardiac catheterization tomorrow morning. The risks and benefits were discussed and the patient agrees to proceed. 2 hyperlipidemia-add statin. He will require lipids and liver in 6 weeks. 3 tobacco abuse-taking counseled on discontinuing. 4 hyperglycemia-glucose mildly elevated on admission laboratories. Check hemoglobin A1c.  Kirk Ruths MD 06/20/2013, 1:04 PM

## 2013-06-22 ENCOUNTER — Encounter (HOSPITAL_COMMUNITY): Payer: Self-pay | Admitting: Cardiology

## 2013-06-22 DIAGNOSIS — E669 Obesity, unspecified: Secondary | ICD-10-CM

## 2013-06-22 DIAGNOSIS — I2510 Atherosclerotic heart disease of native coronary artery without angina pectoris: Secondary | ICD-10-CM | POA: Diagnosis present

## 2013-06-22 LAB — BASIC METABOLIC PANEL
BUN: 12 mg/dL (ref 6–23)
CO2: 21 meq/L (ref 19–32)
CREATININE: 0.84 mg/dL (ref 0.50–1.35)
Calcium: 9.3 mg/dL (ref 8.4–10.5)
Chloride: 103 mEq/L (ref 96–112)
GFR calc Af Amer: >90 mL/min (ref >90)
GFR calc non Af Amer: >90 mL/min (ref >90)
GLUCOSE: 89 mg/dL (ref 70–99)
POTASSIUM: 4.2 meq/L (ref 3.7–5.3)
Sodium: 139 mEq/L (ref 137–147)

## 2013-06-22 LAB — CBC
HEMATOCRIT: 41.3 % (ref 39.0–52.0)
HEMOGLOBIN: 14.2 g/dL (ref 13.0–17.0)
MCH: 32.7 pg (ref 26.0–34.0)
MCHC: 34.4 g/dL (ref 30.0–36.0)
MCV: 95.2 fL (ref 78.0–100.0)
Platelets: 221 10*3/uL (ref 150–400)
RBC: 4.34 MIL/uL (ref 4.22–5.81)
RDW: 13.4 % (ref 11.5–15.5)
WBC: 9 10*3/uL (ref 4.0–10.5)

## 2013-06-22 MED ORDER — ASPIRIN 81 MG PO CHEW: 81.0000 mg | CHEWABLE_TABLET | Freq: Every day | ORAL | Status: DC

## 2013-06-22 MED ORDER — PRASUGREL HCL 10 MG PO TABS: 10.0000 mg | ORAL_TABLET | Freq: Every day | ORAL | Status: DC

## 2013-06-22 MED ORDER — NITROGLYCERIN 0.4 MG SL SUBL: 0.4000 mg | SUBLINGUAL_TABLET | SUBLINGUAL | Status: DC | PRN

## 2013-06-22 MED ORDER — NICOTINE 21 MG/24HR TD PT24: 21.0000 mg | MEDICATED_PATCH | Freq: Every day | TRANSDERMAL | Status: DC

## 2013-06-22 MED ORDER — ZOLPIDEM TARTRATE 5 MG PO TABS
5.0000 mg | ORAL_TABLET | Freq: Once | ORAL | Status: AC
  Administered 2013-06-22: 5 mg via ORAL
  Filled 2013-06-22: qty 1

## 2013-06-22 MED ORDER — METOPROLOL TARTRATE 25 MG PO TABS: 25.0000 mg | ORAL_TABLET | Freq: Two times a day (BID) | ORAL | Status: DC

## 2013-06-22 MED ORDER — ATORVASTATIN CALCIUM 80 MG PO TABS: 80.0000 mg | ORAL_TABLET | Freq: Every day | ORAL | Status: DC

## 2013-06-22 NOTE — Discharge Summary (Addendum)
Physician Discharge Summary  Mark [place] ZOX:[id number] DOB: [birth year] DOA: 06/19/2013  PCP: No primary provider on file.  Admit date: 06/19/2013 Discharge date: 06/22/2013  Time spent: 40 minutes  Recommendations for Outpatient Follow-up:  discharge home with oupt cardiology follow up. Office will arrange.  Discharge Diagnoses:  Principal Problem:   NSTEMI (non-ST elevated myocardial infarction)  Active Problems:   Chest pain   Obesity, unspecified   Tobacco abuse   Other and unspecified hyperlipidemia   CAD in native artery   Discharge Condition: fair  Diet recommendation: cardiac  Filed Weights   06/19/13 1347 06/21/13 2339  Weight: 86.183 kg (190 lb) 86.6 kg (190 lb 14.7 oz)    History of present illness:  Please refer to admission H&P for details, but in brief, 63 y.o. male  With past medical history of hyperlipidemia and tobacco abuse who does not follow with a physician normally and was today working underneath history or when he started experiencing some chest discomfort. It was described as sharp and just left of midsternal radiating down his left arm to his elbow. He did not go anywhere else. It was described as continuous. No associated shortness of breath, diaphoresis, and nausea or lightheadedness. No worsening with deep inspiration. Patient came to the emergency room and already the pain started to diminish. After about 30 minutes in the emergency room, the pain had completely left his chest and is present as a nagging sensation almost completely in his left elbow.  In the emergency room, lab work was unremarkable. Cardiac enzymes were normal. Chest x-ray showed questionable cardiomegaly with mild pulmonary vascular congestion. On admission, patient was noted to have elevated blood pressures and mild tachycardia with medication given for pain, this resolved. Hospitalists were called for evaluation and [facility]   Hospital Course:  NSTEMI  Risk factors include HL, heavy smoking and family hx of CAD.  Admission EKG and troponin unremarkable. Subsequent troponin elevated ( 0.79>>2.56>>5.76). Placed full dose ASA, s/l nitrate. Added metoprolol and , lipitor.. Started heparin drip.  cardiac cath today shows severe disease in the midleft circumflex artery and in the OM1 branch. Patent left main and LAD. EF of 60%.  Needs dual antiplatelet therapy with lifelong aspirin and 12 months presugrel . Prescription provided. Added metoprolol bid. counseled on smoking cessation, diet adherence, medication adherence and lifestyle modifications. Patient motivated to quit. Agrees with applying nicotine patch. Patient is looking for a new PCP in the community. He will follow up with cardiology as outpatient.  Hyperlipidemia  added lipitor. LDL of 133.   Tobacco abuse  Counseled on cessation. Added nicotine patch    Patient is stable for discharge home.   Diet: cardiac.   Code Status: full code  Family Communication: none at bedside  Disposition Plan: home    Consultants:  Cardiology  Procedures:  2D echo. cardiac cath  Antibiotics:  none     Discharge Exam: Filed Vitals:   06/22/13 0744  BP: 119/77  Pulse: 77  Temp: 97.7 F (36.5 C)  Resp: 18    General: Middle aged male in NAD  HEENT: no pallor, moist mucosa  Chest: clear b/l, no added sounds  CVS: N S1&S2, no murmurs, rubs or gallop  Abd: soft, NT, ND, BS+  EXT: warm, no edema, right radial cath site normal, no bleeding  CNS: AAOX3   Discharge Instructions You were cared for by a hospitalist during your hospital stay. If you have any questions  about your discharge medications or the care you received while you were in the hospital after you are discharged, you can call the unit and asked to speak with the hospitalist on call if the hospitalist that took care of you is not available. Once you are discharged, your primary  care physician will handle any further medical issues. Please note that NO REFILLS for any discharge medications will be authorized once you are discharged, as it is imperative that you return to your primary care physician (or establish a relationship with a primary care physician if you do not have one) for your aftercare needs so that they can reassess your need for medications and monitor your lab values.   Future Appointments Provider Department Dept Phone   07/07/2013 12:45 PM Dyann Kief, PA-C [facility] [place] Office [phone]       Medication List    STOP taking these medications       ibuprofen 200 MG tablet  Commonly known as:  ADVIL,MOTRIN         EQUATE PO  Take 1 tablet by mouth daily as needed. Tooth pain    TAKE these medications       aspirin 81 MG chewable tablet  Chew 1 tablet (81 mg total) by mouth daily.     atorvastatin 80 MG tablet  Commonly known as:  LIPITOR  Take 1 tablet (80 mg total) by mouth daily at 6 PM.            metoprolol tartrate 25 MG tablet  Commonly known as:  LOPRESSOR  Take 1 tablet (25 mg total) by mouth 2 (two) times daily.     nicotine 21 mg/24hr patch  Commonly known as:  NICODERM CQ - dosed in mg/24 hours  Place 1 patch (21 mg total) onto the skin daily.     nitroGLYCERIN 0.4 MG SL tablet  Commonly known as:  NITROSTAT  Place 1 tablet (0.4 mg total) under the tongue every 5 (five) minutes as needed for chest pain.     prasugrel 10 MG Tabs tablet  Commonly known as:  EFFIENT  Take 1 tablet (10 mg total) by mouth daily.       No Known Allergies    The results of significant diagnostics from this hospitalization (including imaging, microbiology, ancillary and laboratory) are listed below for reference.    Significant Diagnostic Studies: Dg Chest Portable 1 View  06/19/2013   CLINICAL DATA:  Chest pain  EXAM: PORTABLE CHEST - 1 VIEW  COMPARISON:  None.  FINDINGS: Heart size appears borderline enlarged for  portable technique. Heart size best attack assessed with two-view technique. There is mild pulmonary vascular congestion. Negative for edema or focal consolidation. The trachea is midline. Negative for pneumothorax or visible pleural effusion. No acute bony abnormality.  IMPRESSION: Cannot exclude cardiomegaly. Mild pulmonary vascular congestion is present.   Electronically Signed   By: Britta Mccreedy M.D.   On: 06/19/2013 14:10    Microbiology: No results found for this or any previous visit (from the past 240 hour(s)).   Labs: Basic Metabolic Panel:  Recent Labs Lab 06/19/13 1330 06/19/13 2035 06/22/13 0409  NA 140  --  139  K 3.8  --  4.2  CL 101  --  103  CO2 25  --  21  GLUCOSE 114*  --  89  BUN 14  --  12  CREATININE 1.10 0.88 0.84  CALCIUM 9.6  --  9.3   Liver  Function Tests:  Recent Labs Lab 06/19/13 1330  AST 18  ALT 22  ALKPHOS 88  BILITOT 0.3  PROT 7.6  ALBUMIN 3.8   No results found for this basename: LIPASE, AMYLASE,  in the last 168 hours No results found for this basename: AMMONIA,  in the last 168 hours CBC:  Recent Labs Lab 06/19/13 1330 06/19/13 2035 06/20/13 1500 06/21/13 0640 06/22/13 0409  WBC 7.6 7.8 8.6 7.8 9.0  HGB 15.1 15.4 15.5 14.8 14.2  HCT 43.8 44.1 44.2 43.1 41.3  MCV 96.5 95.5 95.3 94.7 95.2  PLT 212 208 224 219 221   Cardiac Enzymes:  Recent Labs Lab 06/19/13 1330 06/19/13 2035 06/20/13 0001 06/20/13 0600 06/20/13 1320  TROPONINI <0.30 0.79* 2.73* 5.65* 6.52*   BNP: BNP (last 3 results)  Recent Labs  06/19/13 2035  PROBNP 15.8   CBG: No results found for this basename: GLUCAP,  in the last 168 hours     Signed:  Keva Darty  [facility] 06/22/2013, 11:16 AM

## 2013-06-22 NOTE — Discharge Instructions (Signed)
Call [facility] at [phone] if any bleeding, swelling or drainage at cath site.  May shower, no tub baths for 48 hours for groin sticks.   Take 1 NTG, under your tongue, while sitting.  If no relief of pain may repeat NTG, one tab every 5 minutes up to 3 tablets total over 15 minutes.  If no relief CALL 911.  If you have dizziness/lightheadness  while taking NTG, stop taking and call 911.        No lifting over 5 pounds  For 1 week, no work until seen in office, no driving for 1 week.     Acute Coronary Syndrome Acute coronary syndrome (ACS) is an urgent problem in which the blood and oxygen supply to the heart is critically deficient. ACS requires hospitalization because one or more coronary arteries may be blocked. ACS represents a range of conditions including:  Previous angina that is now unstable, lasts longer, happens at rest, or is more intense.  A heart attack, with heart muscle cell injury and death. There are three vital coronary arteries that supply the heart muscle with blood and oxygen so that it can pump blood effectively. If blockages to these arteries develop, blood flow to the heart muscle is reduced. If the heart does not get enough blood, angina may occur as the first warning sign. SYMPTOMS   The most common signs of angina include:  Tightness or squeezing in the chest.  Feeling of heaviness on the chest.  Discomfort in the arms, neck, or jaw.  Shortness of breath and nausea.  Cold, wet skin.  Angina is usually brought on by physical effort or excitement which increase the oxygen needs of the heart. These states increase the blood flow needs of the heart beyond what can be delivered. TREATMENT   Medicines to help discomfort may include nitroglycerin (nitro) in the form of tablets or a spray for rapid relief, or longer-acting forms such as cream, patches, or capsules. (Be aware that there are many side effects and possible interactions with  other drugs).  Other medicines may be used to help the heart pump better.  Procedures to open blocked arteries including angioplasty or stent placement to keep the arteries open.  Open heart surgery may be needed when there are many blockages or they are in critical locations that are best treated with surgery. HOME CARE INSTRUCTIONS   Avoid smoking.  Take one baby or adult aspirin daily, if your caregiver advises. This helps reduce the risk of a heart attack.  It is very important that you follow the angina treatment prescribed by your caregiver. Make arrangements for proper follow-up care.  Eat a heart healthy diet with salt and fat restrictions as advised.  Regular exercise is good for you as long as it does not cause discomfort. Do not begin any new type of exercise until you check with your caregiver.  If you are overweight, you should lose weight.  Try to maintain normal blood lipid levels.  Keep your blood pressure under control as recommended by your caregiver.  You should tell your caregiver right away about any increase in the severity or frequency of your chest discomfort or angina attacks. When you have angina, you should stop what you are doing and sit down. This may bring relief in 3 to 5 minutes. If your caregiver has prescribed nitro, take it as directed.  If your caregiver has given you a follow-up appointment, it is very important to keep  that appointment. Not keeping the appointment could result in a chronic or permanent injury, pain, and disability. If there is any problem keeping the appointment, you must call back to this facility for assistance. SEEK IMMEDIATE MEDICAL CARE IF:   You develop nausea, vomiting, or shortness of breath.  You feel faint, lightheaded, or pass out.  Your chest discomfort gets worse.  You are sweating or experience sudden profound fatigue.  You do not get relief of your chest pain after 3 doses of nitro.  Your discomfort lasts  longer than 15 minutes. MAKE SURE YOU:   Understand these instructions.  Will watch your condition.  Will get help right away if you are not doing well or get worse. Document Released: 02/25/2005 Document Revised: 05/20/2011 Document Reviewed: 09/29/2007 [facility] Patient Information 2014 [place].

## 2013-06-22 NOTE — Progress Notes (Signed)
    Subjective:  No chest pain or dyspnea. Feels well this am.   Objective:  Vital Signs in the last 24 hours: Temp:  [97.7 F (36.5 C)-98.1 F (36.7 C)] 97.9 F (36.6 C) (04/14 0532) Pulse Rate:  [64-81] 77 (04/14 0532) Resp:  [16-20] 20 (04/14 0532) BP: (111-153)/(80-106) 129/85 mmHg (04/14 0532) SpO2:  [98 %-100 %] 98 % (04/14 0532) Weight:  [190 lb 14.7 oz (86.6 kg)] 190 lb 14.7 oz (86.6 kg) (04/13 2339)  Intake/Output from previous day: 04/13 0701 - 04/14 0700 In: 1194.1 [P.O.:720; I.V.:474.1] Out: -   Physical Exam: Pt is alert and oriented, NAD HEENT: normal Neck: JVP - normal Lungs: CTA bilaterally CV: RRR without murmur or gallop Abd: soft, NT, Positive BS, no hepatomegaly Ext: no C/C/E, right radial site clear Skin: warm/dry no rash   Lab Results:  Recent Labs  06/21/13 0640 06/22/13 0409  WBC 7.8 9.0  HGB 14.8 14.2  PLT 219 221    Recent Labs  06/19/13 1330 06/19/13 2035 06/22/13 0409  NA 140  --  139  K 3.8  --  4.2  CL 101  --  103  CO2 25  --  21  GLUCOSE 114*  --  89  BUN 14  --  12  CREATININE 1.10 0.88 0.84    Recent Labs  06/20/13 0600 06/20/13 1320  TROPONINI 5.65* 6.52*    Assessment/Plan:  1. NSTEMI - cath findings noted, now s/p PCI of the LCx with overlapping drug-eluting stents. Continue ASA 81 mg indefinitely and Effient x 12 months. Will arrange his follow-up with Dr Jens Som who saw him initially in consultation.  2. Tobacco - cessation advised. He is motivated to quit.   3. Hyperlipidemia - LDL 133. Started on atorvastatin 80 mg in light of ACS presentation.  Dispo: home this am after cardiac rehab Phase 1. We will arrange his follow-up.   Tonny Bollman, M.D. 06/22/2013, 7:57 AM

## 2013-06-22 NOTE — Progress Notes (Signed)
CARDIAC REHAB PHASE I   PRE:  Rate/Rhythm: 81 SR  BP:  Supine: 119/77  Sitting:  Standing:    SaO2:   MODE:  Ambulation: 1000 ft   POST:  Rate/Rhythm: 93 SR  BP:  Supine:   Sitting: 133/86  Standing:    SaO2:  0755-0900 Pt walked 1000 ft with steady gait. Tolerated well. No CP. Education completed with pt who voiced understanding. Stressed with pt watching carbs as his HGA1C is 6.2. Pt drinks regular soda. Discussed sugar content. Gave heart healthy diet and discussed. Pt eats out a lot. Discussed smoking cessation. Handouts given and encouraged pt to call 1800quitnow if needed. Pt has quit once for 2 weeks but no other attempts. Discussed CRP 2 but work schedule does not allow pt time to attend. Discussed stent and effient. Effient packet given.   Luetta Nutting, RN BSN  06/22/2013 9:15 AM

## 2013-07-01 ENCOUNTER — Telehealth: Payer: Self-pay | Admitting: Cardiology

## 2013-07-01 NOTE — Telephone Encounter (Signed)
Walk in pt Form "[facility]" Paperwork Dropped Off Sent to [facility] For Processing

## 2013-07-07 ENCOUNTER — Encounter: Payer: Self-pay | Admitting: *Deleted

## 2013-07-07 ENCOUNTER — Encounter: Payer: Self-pay | Admitting: Physician Assistant

## 2013-07-07 ENCOUNTER — Ambulatory Visit (INDEPENDENT_AMBULATORY_CARE_PROVIDER_SITE_OTHER): Payer: Commercial Managed Care - PPO | Admitting: Physician Assistant

## 2013-07-07 VITALS — BP 128/84 | HR 66 | Ht 66.0 in | Wt 195.0 lb

## 2013-07-07 DIAGNOSIS — I2510 Atherosclerotic heart disease of native coronary artery without angina pectoris: Secondary | ICD-10-CM

## 2013-07-07 DIAGNOSIS — E785 Hyperlipidemia, unspecified: Secondary | ICD-10-CM

## 2013-07-07 DIAGNOSIS — Z720 Tobacco use: Secondary | ICD-10-CM

## 2013-07-07 DIAGNOSIS — I219 Acute myocardial infarction, unspecified: Secondary | ICD-10-CM

## 2013-07-07 DIAGNOSIS — F17200 Nicotine dependence, unspecified, uncomplicated: Secondary | ICD-10-CM

## 2013-07-07 DIAGNOSIS — E669 Obesity, unspecified: Secondary | ICD-10-CM

## 2013-07-07 NOTE — Assessment & Plan Note (Signed)
Check fasting lipid panel and LFTs in 6 weeks. 

## 2013-07-07 NOTE — Progress Notes (Signed)
HPI:  This is a 63 year old with no prior cardiac history who was admitted with an NSTEMI 06/19/13 treated with stenting of the circumflex and OM, EF 60% he should continue with at least 12 months Effient and ASA. He also has history of hyperlipidemia treated with Lipitor.  The patient says he has trouble getting a deep breath at times but had this problem prior to hospitalization. He contributes it to being overweight. He denies any chest pain, palpitations, dyspnea, dyspnea on exertion, dizziness or presyncope. He is trying to quit smoking. He's only had 2 cigarettes since his MI. 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   No Known Allergies  Current Outpatient Prescriptions on File Prior to Visit: aspirin 81 MG chewable tablet, Chew 1 tablet (81 mg total) by mouth daily., Disp: 30 tablet, Rfl: 3 atorvastatin (LIPITOR) 80 MG tablet, Take 1 tablet (80 mg total) by mouth daily at 6 PM., Disp: 30 tablet, Rfl: 0 metoprolol tartrate (LOPRESSOR) 25 MG tablet, Take 1 tablet (25 mg total) by mouth 2 (two) times daily., Disp: 60 tablet, Rfl: 0 nicotine (NICODERM CQ - DOSED IN MG/24 HOURS) 21 mg/24hr patch, Place 1 patch (21 mg total) onto the skin daily., Disp: 28 patch, Rfl: 0 nitroGLYCERIN (NITROSTAT) 0.4 MG SL tablet, Place 1 tablet (0.4 mg total) under the tongue every 5 (five) minutes as needed for chest pain., Disp: 30 tablet, Rfl: 5 prasugrel (EFFIENT) 10 MG TABS tablet, Take 1 tablet (10 mg total) by mouth daily., Disp: 30 tablet, Rfl: 11  No current facility-administered medications on file prior to visit.   Past Medical History:   Hyperlipemia                                                 NSTEMI (non-ST elevated myocardial infarction)               CAD (coronary artery disease)                                  Comment:PCI of LCX and OM1  Past Surgical History:   CORONARY  ANGIOPLASTY WITH STENT PLACEMENT        06/21/13        Comment:PCI to mid LCX and OM1 with resolute DES  Review of patient's family history indicates:   Heart disease                                             Comment: No family history   Cancer                         Brother                  Social History   Marital Status: Divorced            Spouse Name:                      Years of Education:  Number of children: 2           Occupational History Occupation          Scientist, physiologicalmployer            Comment                                  [employer]   Social History Main Topics   Smoking Status: Current Every Day Smoker        Packs/Day: 0.00  Years:         Smokeless Status: Not on file                      Alcohol Use: No             Drug Use: No             Sexual Activity: Not on file        Other Topics            Concern   None on file  Social History Narrative   None on file    ROS: See history of present illness otherwise negative   PHYSICAL EXAM: Well-nournished, in no acute distress. Neck: No JVD, HJR, Bruit, or thyroid enlargement  Lungs: No tachypnea, clear without wheezing, rales, or rhonchi  Cardiovascular: RRR, PMI not displaced, heart sounds normal, no murmurs, gallops, bruit, thrill, or heave.  Abdomen: BS normal. Soft without organomegaly, masses, lesions or tenderness.  Extremities: Right arm at cath site is stable without hematoma or hemorrhage good radial and brachial pulse, lower extremities without cyanosis, clubbing or edema. Good distal pulses bilateral  SKin: Warm, no lesions or rashes   Musculoskeletal: No deformities  Neuro: no focal signs  BP 128/84  Pulse 66  Ht 5\' 6"  (1.676 m)  Wt 195 lb (88.451 kg)  BMI 31.49 kg/m2   EKG: Normal sinus rhythm, no acute change

## 2013-07-07 NOTE — Assessment & Plan Note (Signed)
Status post MI treated with stenting of the circumflex and OM. Normal LV function. Continue dual antiplatelet therapy. Followup with Dr. Jens Som in 2 months.

## 2013-07-07 NOTE — Assessment & Plan Note (Signed)
Patient only had 2 cigarettes since discharge. He thinks he'll be able to quit soon.

## 2013-07-07 NOTE — Patient Instructions (Addendum)
Your physician recommends that you schedule a follow-up appointment in: WITH DR. CRENSHAW IN 2 MONTHS  Your physician recommends that you return for lab work in: WITHIN 6 WEEKS (LFT, LIPIDS)  Your physician recommends that you continue on your current medications as directed. Please refer to the Current Medication list given to you today.  You may return to work on light duty. No lifting OVER 25 POUNDS.

## 2013-07-07 NOTE — Assessment & Plan Note (Signed)
Weight loss recommended 

## 2013-07-23 ENCOUNTER — Other Ambulatory Visit: Payer: Self-pay | Admitting: Internal Medicine

## 2013-07-30 ENCOUNTER — Other Ambulatory Visit: Payer: Self-pay | Admitting: Cardiology

## 2013-07-30 NOTE — Telephone Encounter (Signed)
Rx was sent to pharmacy electronically. 

## 2013-08-16 ENCOUNTER — Other Ambulatory Visit (INDEPENDENT_AMBULATORY_CARE_PROVIDER_SITE_OTHER): Payer: Commercial Managed Care - PPO

## 2013-08-16 ENCOUNTER — Encounter (HOSPITAL_COMMUNITY): Payer: Self-pay | Admitting: Emergency Medicine

## 2013-08-16 ENCOUNTER — Emergency Department (HOSPITAL_COMMUNITY)
Admission: EM | Admit: 2013-08-16 | Discharge: 2013-08-17 | Disposition: A | Discharge status: Home / Self Care | Payer: Commercial Managed Care - PPO | Attending: Emergency Medicine | Admitting: Emergency Medicine

## 2013-08-16 DIAGNOSIS — I2510 Atherosclerotic heart disease of native coronary artery without angina pectoris: Secondary | ICD-10-CM | POA: Insufficient documentation

## 2013-08-16 DIAGNOSIS — Z79899 Other long term (current) drug therapy: Secondary | ICD-10-CM | POA: Insufficient documentation

## 2013-08-16 DIAGNOSIS — I252 Old myocardial infarction: Secondary | ICD-10-CM | POA: Insufficient documentation

## 2013-08-16 DIAGNOSIS — E785 Hyperlipidemia, unspecified: Secondary | ICD-10-CM

## 2013-08-16 DIAGNOSIS — R21 Rash and other nonspecific skin eruption: Secondary | ICD-10-CM | POA: Insufficient documentation

## 2013-08-16 DIAGNOSIS — F17200 Nicotine dependence, unspecified, uncomplicated: Secondary | ICD-10-CM | POA: Insufficient documentation

## 2013-08-16 DIAGNOSIS — I219 Acute myocardial infarction, unspecified: Secondary | ICD-10-CM

## 2013-08-16 DIAGNOSIS — Z7982 Long term (current) use of aspirin: Secondary | ICD-10-CM | POA: Insufficient documentation

## 2013-08-16 DIAGNOSIS — Z9861 Coronary angioplasty status: Secondary | ICD-10-CM | POA: Insufficient documentation

## 2013-08-16 LAB — LIPID PANEL
CHOL/HDL RATIO: 3
Cholesterol: 122 mg/dL (ref 0–200)
HDL: 36.5 mg/dL — ABNORMAL LOW (ref >39.00)
LDL Cholesterol: 62 mg/dL (ref 0–99)
NonHDL: 85.5
Triglycerides: 119 mg/dL (ref 0.0–149.0)
VLDL: 23.8 mg/dL (ref 0.0–40.0)

## 2013-08-16 LAB — HEPATIC FUNCTION PANEL
ALK PHOS: 88 U/L (ref 39–117)
ALT: 24 U/L (ref 0–53)
AST: 21 U/L (ref 0–37)
Albumin: 3.7 g/dL (ref 3.5–5.2)
Bilirubin, Direct: 0.2 mg/dL (ref 0.0–0.3)
TOTAL PROTEIN: 6.7 g/dL (ref 6.0–8.3)
Total Bilirubin: 0.7 mg/dL (ref 0.2–1.2)

## 2013-08-16 NOTE — ED Notes (Signed)
Patient explains that he was working in the yard on Saturday. Afterward he developed a gradually increasing rash. The rash is visible on arms and patient states that he has the rash in his groin are and posterior knees. Appears as raised reddened skin without ulceration. Denies shortness of breath or other allergic symptoms. Has been using cortisone cream on hands with good effect.

## 2013-08-17 NOTE — Discharge Instructions (Signed)
Continue to use cort-aid 10 as directed. Take Benadryl 50 milligrams 4 times daily as needed for itch. Benadryl may cause drowsiness. Don't drive or operate machinery while using Benadryl. Call Dr.Tafeen for scheduled office appointment if your rash has not improved in a week

## 2013-08-17 NOTE — ED Notes (Signed)
Pt states he was working in the yard and thinks he was bite by something but he does not know what bite him. Pt states that he had a rash that started off as a white bump on his right arm and then changed to a rash redness and raised arms and groin.

## 2013-08-17 NOTE — ED Provider Notes (Signed)
CSN: [id number]     Arrival date & time 08/16/13  2001 History   First MD Initiated Contact with Patient 08/17/13 0006     Chief Complaint  Patient presents with  . Rash     (Consider location/radiation/quality/duration/timing/severity/associated sxs/prior Treatment) HPI Complains of rash pruritic in nature onset 08/14/2013. Breasts on both forearms and groin bilaterally. He denies fever he thinks he may have been bitten by an insect rash is also on his legs but has since resolved on his legs. He treated himself with cortisone 10 cream and would "equate", however and no treatment in the past 24 hours. Denies shortness of breath denies malaise denies fever denies other associated symptoms Past Medical History  Diagnosis Date  . Hyperlipemia   . NSTEMI (non-ST elevated myocardial infarction)   . CAD (coronary artery disease)     PCI of LCX and OM1   Past Surgical History  Procedure Laterality Date  . Coronary angioplasty with stent placement  06/21/13    PCI to mid LCX and OM1 with resolute DES   Family History  Problem Relation Age of Onset  . Heart disease      No family history  . Cancer Brother    History  Substance Use Topics  . Smoking status: Current Every Day Smoker -- 1.00 packs/day    Types: Cigarettes  . Smokeless tobacco: Not on file  . Alcohol Use: No    Review of Systems  Constitutional: Negative.   HENT: Negative.   Respiratory: Negative.   Cardiovascular: Negative.   Gastrointestinal: Negative.   Musculoskeletal: Negative.   Skin: Positive for rash.  Neurological: Negative.   Psychiatric/Behavioral: Negative.   All other systems reviewed and are negative.     Allergies  Review of patient's allergies indicates no known allergies.  Home Medications   Prior to Admission medications   Medication Sig Start Date End Date Taking? Authorizing Provider  aspirin 81 MG chewable tablet Chew 1 tablet (81 mg total) by mouth daily. 06/22/13   Nishant Dhungel,  MD  atorvastatin (LIPITOR) 80 MG tablet TAKE 1 TABLET BY MOUTH EVERY DAY AT 6 PM 07/30/13   Lewayne Bunting, MD  metoprolol tartrate (LOPRESSOR) 25 MG tablet TAKE 1 TABLET BY MOUTH TWICE A DAY 07/30/13   Lewayne Bunting, MD  nicotine (NICODERM CQ - DOSED IN MG/24 HOURS) 21 mg/24hr patch Place 1 patch (21 mg total) onto the skin daily. 06/22/13   Nishant Dhungel, MD  nitroGLYCERIN (NITROSTAT) 0.4 MG SL tablet Place 1 tablet (0.4 mg total) under the tongue every 5 (five) minutes as needed for chest pain. 06/22/13   Nishant Dhungel, MD  prasugrel (EFFIENT) 10 MG TABS tablet Take 1 tablet (10 mg total) by mouth daily. 06/22/13   Nada Boozer, NP   BP 146/76  Pulse 90  Temp(Src) 100.4 F (38 C) (Oral)  Resp 20  Ht 5\' 6"  (1.676 m)  Wt 189 lb 3.2 oz (85.821 kg)  BMI 30.55 kg/m2  SpO2 96% Physical Exam  Nursing note and vitals reviewed. Constitutional: He appears well-developed and well-nourished.  HENT:  Head: Normocephalic and atraumatic.  Eyes: Conjunctivae are normal. Pupils are equal, round, and reactive to light.  Neck: Neck supple. No tracheal deviation present. No thyromegaly present.  Cardiovascular: Normal rate and regular rhythm.   No murmur heard. Pulmonary/Chest: Effort normal and breath sounds normal.  Abdominal: Soft. Bowel sounds are normal. He exhibits no distension. There is no tenderness.  Musculoskeletal: Normal range of motion. He  exhibits no edema and no tenderness.  Neurological: He is alert. Coordination normal.  Skin: Skin is warm and dry. Rash noted.  Pinkish confluent hive-like rash on right upper arm and forearm and bilateral groin.including genitalia. Not involving palms or soles  Psychiatric: He has a normal mood and affect.    ED Course  Procedures (including critical care time) Labs Review Labs Reviewed - No data to display  Imaging Review No results found.   EKG Interpretation None      MDM   Final diagnoses:  None   Doubt infectious etiology  of rash. Rash is paretic, nonpainful. Temperature recheck, normal, no antipyretics given. Plan Benadryl 50 mg 4 times daily as needed for itch. Continue hydrocortisone cream as directed. Dermatology referral if not improved in a week Dx Rash     Doug SouSam Sianna Garofano, MD 08/17/13 [phone]

## 2013-08-26 ENCOUNTER — Telehealth: Payer: Self-pay | Admitting: Cardiology

## 2013-08-26 ENCOUNTER — Encounter: Payer: Self-pay | Admitting: *Deleted

## 2013-08-26 NOTE — Telephone Encounter (Signed)
Spoke with pt, Aware of dr Ludwig Clarks recommendations.  Letter generated and mailed to pt home at his request.

## 2013-08-26 NOTE — Telephone Encounter (Signed)
Spoke with pt, aware have faxed letter to the number provided.

## 2013-08-26 NOTE — Telephone Encounter (Signed)
New Message:  Pt wants to know when he can return to work on regular duty.. Pt states he is now at work on light duty. Requests a call back

## 2013-08-26 NOTE — Telephone Encounter (Signed)
Patient can return to work without restrictions [employer]

## 2013-08-26 NOTE — Telephone Encounter (Signed)
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 He is having no problems and wants to return to regular duty. Will forward for dr Jens Som review

## 2013-08-26 NOTE — Telephone Encounter (Signed)
New message      Please fax a note stating pt can return to work full time--full duty.  Fax note to [phone] attn Elly Modena

## 2013-09-02 ENCOUNTER — Other Ambulatory Visit: Payer: Self-pay

## 2013-09-02 MED ORDER — ATORVASTATIN CALCIUM 80 MG PO TABS: 80.0000 mg | ORAL_TABLET | Freq: Every day | ORAL | Status: DC

## 2013-09-02 MED ORDER — METOPROLOL TARTRATE 25 MG PO TABS: 25.0000 mg | ORAL_TABLET | Freq: Two times a day (BID) | ORAL | Status: DC

## 2013-09-30 ENCOUNTER — Ambulatory Visit (INDEPENDENT_AMBULATORY_CARE_PROVIDER_SITE_OTHER): Payer: Commercial Managed Care - PPO | Admitting: Cardiology

## 2013-09-30 ENCOUNTER — Encounter: Payer: Self-pay | Admitting: Cardiology

## 2013-09-30 VITALS — BP 118/76 | HR 69 | Ht 66.0 in | Wt 192.6 lb

## 2013-09-30 DIAGNOSIS — F17200 Nicotine dependence, unspecified, uncomplicated: Secondary | ICD-10-CM

## 2013-09-30 DIAGNOSIS — E785 Hyperlipidemia, unspecified: Secondary | ICD-10-CM

## 2013-09-30 DIAGNOSIS — Z720 Tobacco use: Secondary | ICD-10-CM

## 2013-09-30 DIAGNOSIS — I2510 Atherosclerotic heart disease of native coronary artery without angina pectoris: Secondary | ICD-10-CM

## 2013-09-30 NOTE — Assessment & Plan Note (Signed)
Continue statin. 

## 2013-09-30 NOTE — Patient Instructions (Signed)
Your physician wants you to follow-up in: 6 MONTHS WITH DR CRENSHAW You will receive a reminder letter in the mail two months in advance. If you don't receive a letter, please call our office to schedule the follow-up appointment.  

## 2013-09-30 NOTE — Assessment & Plan Note (Signed)
Continue aspirin, effient and statin; will DC effient one year from time of PCI.

## 2013-09-30 NOTE — Progress Notes (Signed)
      HPI: FU CAD; admitted with an NSTEMI 06/19/13; cath revealed 70 Lcx, 80 OM, 75 small posterolateral; EF 60; treated with stenting of the circumflex and OM, EF 60%. Since last seen, He denies dyspnea on exertion, orthopnea, PND, pedal edema, palpitations, syncope or chest pain.   Current Outpatient Prescriptions  Medication Sig Dispense Refill  . acetaminophen (TYLENOL) 325 MG tablet Take 650 mg by mouth every 6 (six) hours as needed for mild pain or headache.      Marland Kitchen aspirin 81 MG chewable tablet Chew 1 tablet (81 mg total) by mouth daily.  30 tablet  3  . atorvastatin (LIPITOR) 80 MG tablet Take 1 tablet (80 mg total) by mouth daily.  30 tablet  1  . metoprolol tartrate (LOPRESSOR) 25 MG tablet Take 1 tablet (25 mg total) by mouth 2 (two) times daily.  60 tablet  1  . nitroGLYCERIN (NITROSTAT) 0.4 MG SL tablet Place 1 tablet (0.4 mg total) under the tongue every 5 (five) minutes as needed for chest pain.  30 tablet  5  . prasugrel (EFFIENT) 10 MG TABS tablet Take 1 tablet (10 mg total) by mouth daily.  30 tablet  11   No current facility-administered medications for this visit.     Past Medical History  Diagnosis Date  . Hyperlipemia   . NSTEMI (non-ST elevated myocardial infarction)   . CAD (coronary artery disease)     PCI of LCX and OM1    Past Surgical History  Procedure Laterality Date  . Coronary angioplasty with stent placement  06/21/13    PCI to mid LCX and OM1 with resolute DES    History   Social History  . Marital Status: Divorced    Spouse Name: N/A    Number of Children: 2  . Years of Education: N/A   Occupational History  .  [employer]   Social History Main Topics  . Smoking status: Current Every Day Smoker -- 1.00 packs/day    Types: Cigarettes  . Smokeless tobacco: Not on file  . Alcohol Use: No  . Drug Use: No  . Sexual Activity: Not on file   Other Topics Concern  . Not on file   Social History Narrative  . No narrative on  file    ROS: no fevers or chills, productive cough, hemoptysis, dysphasia, odynophagia, melena, hematochezia, dysuria, hematuria, rash, seizure activity, orthopnea, PND, pedal edema, claudication. Remaining systems are negative.  Physical Exam: Well-developed well-nourished in no acute distress.  Skin is warm and dry.  HEENT is normal.  Neck is supple.  Chest is clear to auscultation with normal expansion.  Cardiovascular exam is regular rate and rhythm.  Abdominal exam nontender or distended. No masses palpated. Extremities show no edema. neuro grossly intact  ECG Sinus rhythm at a rate of 69. Nonspecific ST changes.

## 2013-09-30 NOTE — Assessment & Plan Note (Signed)
Patient counseled on discontinuing. 

## 2013-11-01 ENCOUNTER — Other Ambulatory Visit: Payer: Self-pay

## 2013-11-01 MED ORDER — METOPROLOL TARTRATE 25 MG PO TABS: 25.0000 mg | ORAL_TABLET | Freq: Two times a day (BID) | ORAL | Status: DC

## 2013-11-01 MED ORDER — ATORVASTATIN CALCIUM 80 MG PO TABS: 80.0000 mg | ORAL_TABLET | Freq: Every day | ORAL | Status: DC

## 2013-11-04 ENCOUNTER — Ambulatory Visit: Payer: Self-pay | Admitting: Cardiology

## 2014-01-17 ENCOUNTER — Telehealth: Payer: Self-pay | Admitting: Cardiology

## 2014-01-17 NOTE — Telephone Encounter (Signed)
Patient brought in [employer] forms to be completed by Dr. Jens Som, patient to bring check or money order back in today.

## 2014-01-28 NOTE — Telephone Encounter (Addendum)
11/202015 Received FMLA Forms back from [place] at [facility].  Waiting for patient to come in and fill out his information. cbr  01/31/2014 Patient came in to fill out the rest of his forms, I made copy of completed forms and gave copy to patient and also faxed copy to [employer]. cbr

## 2014-02-17 ENCOUNTER — Encounter (HOSPITAL_COMMUNITY): Payer: Self-pay | Admitting: Interventional Cardiology

## 2014-04-04 ENCOUNTER — Ambulatory Visit: Payer: Self-pay | Admitting: Cardiology

## 2014-04-06 ENCOUNTER — Encounter: Payer: Self-pay | Admitting: Cardiology

## 2014-05-23 ENCOUNTER — Other Ambulatory Visit: Payer: Self-pay

## 2014-05-23 ENCOUNTER — Telehealth: Payer: Self-pay | Admitting: Cardiology

## 2014-05-23 MED ORDER — METOPROLOL TARTRATE 25 MG PO TABS: 25.0000 mg | ORAL_TABLET | Freq: Two times a day (BID) | ORAL | Status: DC

## 2014-05-23 MED ORDER — ATORVASTATIN CALCIUM 80 MG PO TABS: 80.0000 mg | ORAL_TABLET | Freq: Every day | ORAL | Status: DC

## 2014-05-23 NOTE — Telephone Encounter (Signed)
°  1. Which medications need to be refilled? Metoprolol, and Atorvastatin  Which pharmacy is medication to be sent to? [facility]  3. Do they need a 30 day or 90 day supply? 30 and refills  4. Would they like a call back once the medication has been sent to the pharmacy? no

## 2014-06-14 NOTE — Progress Notes (Signed)
      HPI: FU CAD; admitted with an NSTEMI 06/19/13; cath revealed 70 Lcx, 80 OM, 75 small posterolateral; EF 60; treated with stenting of the circumflex and OM, EF 60%. Since last seen, the patient denies any dyspnea on exertion, orthopnea, PND, pedal edema, palpitations, syncope or chest pain.   Current Outpatient Prescriptions  Medication Sig Dispense Refill  . acetaminophen (TYLENOL) 325 MG tablet Take 650 mg by mouth every 6 (six) hours as needed for mild pain or headache.    Marland Kitchen aspirin 81 MG chewable tablet Chew 1 tablet (81 mg total) by mouth daily. 30 tablet 3  . atorvastatin (LIPITOR) 80 MG tablet Take 1 tablet (80 mg total) by mouth daily. 30 tablet 1  . HYDROcodone-acetaminophen (NORCO) 7.5-325 MG per tablet Take 1 tablet by mouth every 6 (six) hours as needed for moderate pain.    . metoprolol tartrate (LOPRESSOR) 25 MG tablet Take 1 tablet (25 mg total) by mouth 2 (two) times daily. 60 tablet 1  . nitroGLYCERIN (NITROSTAT) 0.4 MG SL tablet Place 1 tablet (0.4 mg total) under the tongue every 5 (five) minutes as needed for chest pain. 30 tablet 5  . prasugrel (EFFIENT) 10 MG TABS tablet Take 1 tablet (10 mg total) by mouth daily. 30 tablet 11  . SALINE NASAL SPRAY NA Place 1 spray into the nose as needed.     No current facility-administered medications for this visit.     Past Medical History  Diagnosis Date  . Hyperlipemia   . NSTEMI (non-ST elevated myocardial infarction)   . CAD (coronary artery disease)     PCI of LCX and OM1    Past Surgical History  Procedure Laterality Date  . Coronary angioplasty with stent placement  06/21/13    PCI to mid LCX and OM1 with resolute DES  . Left heart catheterization with coronary angiogram N/A 06/21/2013    Procedure: LEFT HEART CATHETERIZATION WITH CORONARY ANGIOGRAM;  Surgeon: Corky Crafts, MD;  Location: [facility] CATH LAB;  Service: Cardiovascular;  Laterality: N/A;    History   Social History  . Marital Status: Divorced      Spouse Name: N/A  . Number of Children: 2  . Years of Education: N/A   Occupational History  .  [employer]   Social History Main Topics  . Smoking status: Current Every Day Smoker -- 1.00 packs/day    Types: Cigarettes  . Smokeless tobacco: Not on file  . Alcohol Use: No  . Drug Use: No  . Sexual Activity: Not on file   Other Topics Concern  . Not on file   Social History Narrative    ROS: no fevers or chills, productive cough, hemoptysis, dysphasia, odynophagia, melena, hematochezia, dysuria, hematuria, rash, seizure activity, orthopnea, PND, pedal edema, claudication. Remaining systems are negative.  Physical Exam: Well-developed well-nourished in no acute distress.  Skin is warm and dry.  HEENT is normal.  Neck is supple.  Chest is clear to auscultation with normal expansion.  Cardiovascular exam is regular rate and rhythm.  Abdominal exam nontender or distended. No masses palpated. Extremities show no edema. neuro grossly intact  ECG normal sinus rhythm, no ST changes.

## 2014-06-16 ENCOUNTER — Ambulatory Visit (INDEPENDENT_AMBULATORY_CARE_PROVIDER_SITE_OTHER): Payer: Commercial Managed Care - PPO | Admitting: Cardiology

## 2014-06-16 ENCOUNTER — Encounter: Payer: Self-pay | Admitting: Cardiology

## 2014-06-16 VITALS — BP 126/74 | HR 70 | Ht 65.0 in | Wt 193.7 lb

## 2014-06-16 DIAGNOSIS — I2510 Atherosclerotic heart disease of native coronary artery without angina pectoris: Secondary | ICD-10-CM | POA: Diagnosis not present

## 2014-06-16 MED ORDER — NITROGLYCERIN 0.4 MG SL SUBL: 0.4000 mg | SUBLINGUAL_TABLET | SUBLINGUAL | Status: DC | PRN

## 2014-06-16 NOTE — Addendum Note (Signed)
Addended by: Freddi Starr on: 06/16/2014 08:52 AM   Modules accepted: Orders

## 2014-06-16 NOTE — Assessment & Plan Note (Signed)
Continue statin. Check lipids and liver. 

## 2014-06-16 NOTE — Patient Instructions (Signed)
Your physician wants you to follow-up in: ONE YEAR WITH DR Shelda Pal will receive a reminder letter in the mail two months in advance. If you don't receive a letter, please call our office to schedule the follow-up appointment.   STOP EFFIENT  Your physician recommends that you return for LAB WORK WHEN FASTING

## 2014-06-16 NOTE — Assessment & Plan Note (Signed)
Continue aspirin and statin. It has been 1 year since his previous PCI. Discontinue Effient.

## 2014-06-16 NOTE — Assessment & Plan Note (Signed)
Patient counseled on discontinuing. 

## 2014-06-21 LAB — HEPATIC FUNCTION PANEL
ALK PHOS: 88 U/L (ref 39–117)
ALT: 29 U/L (ref 0–53)
AST: 21 U/L (ref 0–37)
Albumin: 3.7 g/dL (ref 3.5–5.2)
BILIRUBIN DIRECT: 0.1 mg/dL (ref 0.0–0.3)
BILIRUBIN INDIRECT: 0.2 mg/dL (ref 0.2–1.2)
BILIRUBIN TOTAL: 0.3 mg/dL (ref 0.2–1.2)
Total Protein: 6.5 g/dL (ref 6.0–8.3)

## 2014-06-21 LAB — LIPID PANEL
CHOL/HDL RATIO: 3.3 ratio
Cholesterol: 117 mg/dL (ref 0–200)
HDL: 35 mg/dL — ABNORMAL LOW (ref >=40)
LDL CALC: 53 mg/dL (ref 0–99)
Triglycerides: 146 mg/dL (ref <150)
VLDL: 29 mg/dL (ref 0–40)

## 2014-06-23 ENCOUNTER — Encounter: Payer: Self-pay | Admitting: *Deleted

## 2014-07-18 ENCOUNTER — Other Ambulatory Visit: Payer: Self-pay

## 2014-07-18 MED ORDER — ATORVASTATIN CALCIUM 80 MG PO TABS: 80.0000 mg | ORAL_TABLET | Freq: Every day | ORAL | Status: DC

## 2014-07-18 MED ORDER — METOPROLOL TARTRATE 25 MG PO TABS: 25.0000 mg | ORAL_TABLET | Freq: Two times a day (BID) | ORAL | Status: DC

## 2015-02-28 ENCOUNTER — Other Ambulatory Visit: Payer: Self-pay | Admitting: *Deleted

## 2015-02-28 MED ORDER — ATORVASTATIN CALCIUM 80 MG PO TABS: 80.0000 mg | ORAL_TABLET | Freq: Every day | ORAL | Status: DC

## 2015-02-28 MED ORDER — METOPROLOL TARTRATE 25 MG PO TABS: 25.0000 mg | ORAL_TABLET | Freq: Two times a day (BID) | ORAL | Status: DC

## 2015-06-13 NOTE — Progress Notes (Signed)
      HPI: FU CAD; admitted with an NSTEMI 06/19/13; cath revealed 70 Lcx, 80 OM, 75 small posterolateral; EF 60; treated with stenting of the circumflex and OM, EF 60%. Since last seen, the patient denies any dyspnea on exertion, orthopnea, PND, pedal edema, palpitations, syncope or chest pain.   Current Outpatient Prescriptions  Medication Sig Dispense Refill  . acetaminophen (TYLENOL) 325 MG tablet Take 650 mg by mouth every 6 (six) hours as needed for mild pain or headache.    Marland Kitchen aspirin 81 MG chewable tablet Chew 1 tablet (81 mg total) by mouth daily. 30 tablet 3  . atorvastatin (LIPITOR) 80 MG tablet Take 1 tablet (80 mg total) by mouth daily. 30 tablet 5  . metoprolol tartrate (LOPRESSOR) 25 MG tablet Take 1 tablet (25 mg total) by mouth 2 (two) times daily. 60 tablet 5  . nitroGLYCERIN (NITROSTAT) 0.4 MG SL tablet Place 1 tablet (0.4 mg total) under the tongue every 5 (five) minutes as needed for chest pain. 25 tablet 12   No current facility-administered medications for this visit.     Past Medical History  Diagnosis Date  . Hyperlipemia   . NSTEMI (non-ST elevated myocardial infarction) (HCC)   . CAD (coronary artery disease)     PCI of LCX and OM1    Past Surgical History  Procedure Laterality Date  . Coronary angioplasty with stent placement  06/21/13    PCI to mid LCX and OM1 with resolute DES  . Left heart catheterization with coronary angiogram N/A 06/21/2013    Procedure: LEFT HEART CATHETERIZATION WITH CORONARY ANGIOGRAM;  Surgeon: Corky Crafts, MD;  Location: [facility] CATH LAB;  Service: Cardiovascular;  Laterality: N/A;    Social History   Social History  . Marital Status: Divorced    Spouse Name: N/A  . Number of Children: 2  . Years of Education: N/A   Occupational History  .  [employer]   Social History Main Topics  . Smoking status: Current Every Day Smoker -- 1.00 packs/day    Types: Cigarettes  . Smokeless tobacco: Never Used  .  Alcohol Use: No  . Drug Use: No  . Sexual Activity: Not on file   Other Topics Concern  . Not on file   Social History Narrative    Family History  Problem Relation Age of Onset  . Heart disease      No family history  . Cancer Brother     ROS: no fevers or chills, productive cough, hemoptysis, dysphasia, odynophagia, melena, hematochezia, dysuria, hematuria, rash, seizure activity, orthopnea, PND, pedal edema, claudication. Remaining systems are negative.  Physical Exam: Well-developed well-nourished in no acute distress.  Skin is warm and dry.  HEENT is normal.  Neck is supple.  Chest is clear to auscultation with normal expansion.  Cardiovascular exam is regular rate and rhythm.  Abdominal exam nontender or distended. No masses palpated. Extremities show no edema. neuro grossly intact  ECG Normal sinus rhythm, first-degree AV block, right axis deviation.

## 2015-06-16 ENCOUNTER — Ambulatory Visit (INDEPENDENT_AMBULATORY_CARE_PROVIDER_SITE_OTHER): Payer: Commercial Managed Care - PPO | Admitting: Cardiology

## 2015-06-16 ENCOUNTER — Encounter: Payer: Self-pay | Admitting: Cardiology

## 2015-06-16 VITALS — BP 134/86 | HR 70 | Ht 66.0 in | Wt 191.0 lb

## 2015-06-16 DIAGNOSIS — E669 Obesity, unspecified: Secondary | ICD-10-CM | POA: Diagnosis not present

## 2015-06-16 DIAGNOSIS — Z720 Tobacco use: Secondary | ICD-10-CM | POA: Diagnosis not present

## 2015-06-16 DIAGNOSIS — I2510 Atherosclerotic heart disease of native coronary artery without angina pectoris: Secondary | ICD-10-CM | POA: Diagnosis not present

## 2015-06-16 LAB — LIPID PANEL
Cholesterol: 112 mg/dL — ABNORMAL LOW (ref 125–200)
HDL: 39 mg/dL — AB (ref >=40)
LDL Cholesterol: 57 mg/dL (ref <130)
Total CHOL/HDL Ratio: 2.9 Ratio (ref <=5.0)
Triglycerides: 79 mg/dL (ref <150)
VLDL: 16 mg/dL (ref <30)

## 2015-06-16 LAB — HEPATIC FUNCTION PANEL
ALBUMIN: 4.1 g/dL (ref 3.6–5.1)
ALK PHOS: 82 U/L (ref 40–115)
ALT: 24 U/L (ref 9–46)
AST: 19 U/L (ref 10–35)
Bilirubin, Direct: 0.1 mg/dL (ref <=0.2)
Indirect Bilirubin: 0.3 mg/dL (ref 0.2–1.2)
TOTAL PROTEIN: 6.7 g/dL (ref 6.1–8.1)
Total Bilirubin: 0.4 mg/dL (ref 0.2–1.2)

## 2015-06-16 NOTE — Patient Instructions (Signed)
Medication Instructions:   NO CHANGE  Labwork:  Your physician recommends that you HAVE LAB WORK TODAY  Follow-Up:  Your physician wants you to follow-up in: ONE YEAR WITH DR CRENSHAW You will receive a reminder letter in the mail two months in advance. If you don't receive a letter, please call our office to schedule the follow-up appointment.   If you need a refill on your cardiac medications before your next appointment, please call your pharmacy.    

## 2015-06-16 NOTE — Assessment & Plan Note (Signed)
We discussed lifestyle modification including diet and exercise.

## 2015-06-16 NOTE — Assessment & Plan Note (Signed)
Continue Lipitor. Check lipids and liver.

## 2015-06-16 NOTE — Assessment & Plan Note (Signed)
Patient counseled on discontinuing. 

## 2015-06-16 NOTE — Assessment & Plan Note (Signed)
Continue aspirin and statin. Educated on lifestyle modification. He is not following the diet or exercising routinely.

## 2015-07-06 ENCOUNTER — Other Ambulatory Visit: Payer: Self-pay | Admitting: Cardiology

## 2015-07-06 NOTE — Telephone Encounter (Signed)
REFILL 

## 2015-08-25 ENCOUNTER — Other Ambulatory Visit: Payer: Self-pay | Admitting: *Deleted

## 2015-08-25 MED ORDER — METOPROLOL TARTRATE 25 MG PO TABS: 25.0000 mg | ORAL_TABLET | Freq: Two times a day (BID) | ORAL | Status: DC

## 2015-08-25 MED ORDER — ATORVASTATIN CALCIUM 80 MG PO TABS: 80.0000 mg | ORAL_TABLET | Freq: Every day | ORAL | Status: DC

## 2015-08-25 NOTE — Telephone Encounter (Signed)
Rx(s) sent to pharmacy electronically.  

## 2016-02-14 ENCOUNTER — Other Ambulatory Visit: Payer: Self-pay | Admitting: Cardiology

## 2016-03-18 ENCOUNTER — Other Ambulatory Visit: Payer: Self-pay | Admitting: Cardiology

## 2016-07-19 ENCOUNTER — Other Ambulatory Visit: Payer: Self-pay | Admitting: Cardiology

## 2016-08-19 ENCOUNTER — Other Ambulatory Visit: Payer: Self-pay | Admitting: Cardiology

## 2016-09-17 ENCOUNTER — Other Ambulatory Visit: Payer: Self-pay | Admitting: Cardiology

## 2016-10-21 ENCOUNTER — Other Ambulatory Visit: Payer: Self-pay | Admitting: Cardiology

## 2016-11-08 ENCOUNTER — Other Ambulatory Visit: Payer: Self-pay | Admitting: Cardiology

## 2016-11-08 MED ORDER — ATORVASTATIN CALCIUM 80 MG PO TABS: 80.0000 mg | ORAL_TABLET | Freq: Every day | ORAL | 0 refills | Status: DC

## 2016-11-08 NOTE — Telephone Encounter (Signed)
°*  STAT* If patient is at the pharmacy, call can be transferred to refill team.   1. Which medications need to be refilled? (please list name of each medication and dose if known) Metoprolol and Atorvastatin  2. Which pharmacy/location (including street and city if local pharmacy) is medication to be sent to?[facility] [address]  3. Do they need a 30 day or 90 day supply? 30 and refills

## 2016-11-08 NOTE — Telephone Encounter (Signed)
Refill submitted to patient's preferred pharmacy. Attempted to reach patient no answer or VM pickup.

## 2016-11-09 ENCOUNTER — Other Ambulatory Visit: Payer: Self-pay | Admitting: Cardiology

## 2016-11-30 ENCOUNTER — Other Ambulatory Visit: Payer: Self-pay | Admitting: Cardiology

## 2019-08-16 ENCOUNTER — Ambulatory Visit: Payer: Medicare Other | Attending: Internal Medicine

## 2019-08-16 DIAGNOSIS — Z23 Encounter for immunization: Secondary | ICD-10-CM

## 2019-08-16 NOTE — Progress Notes (Signed)
   Covid-19 Vaccination [facility]  Name:  Justyn Sobotta    MRN: [id number] DOB: 07/13/1950  08/16/2019  Mr. Strawder was observed post Covid-19 immunization for 15 minutes without incident. He was provided with Vaccine Information Sheet and instruction to access the V-Safe system.   Mr. Michalik was instructed to call 911 with any severe reactions post vaccine: . Difficulty breathing  . Swelling of face and throat  . A fast heartbeat  . A bad rash all over body  . Dizziness and weakness   Immunizations Administered    Name Date Dose VIS Date Route   Pfizer COVID-19 Vaccine 08/16/2019  9:30 AM 0.3 mL 05/05/2018 Intramuscular   Manufacturer: Pfizer, Inc   Lot: [id number]   NDC: [address]-2     

## 2019-08-16 NOTE — Progress Notes (Signed)
   Covid-19 Vaccination Clinic  Name:  Yitzchak Kothari    MRN: [id number] DOB: 07/20/1950  08/16/2019  Mr. Crissman was observed post Covid-19 immunization for 15 minutes without incident. He was provided with Vaccine Information Sheet and instruction to access the V-Safe system.   Mr. Geraldo was instructed to call 911 with any severe reactions post vaccine: Marland Kitchen Difficulty breathing  . Swelling of face and throat  . A fast heartbeat  . A bad rash all over body  . Dizziness and weakness   Immunizations Administered    Name Date Dose VIS Date Route   Pfizer COVID-19 Vaccine 08/16/2019  9:30 AM 0.3 mL 05/05/2018 Intramuscular   Manufacturer: [employer], [employer]   Lot: [id number]   NDC: [address]-4

## 2019-12-27 ENCOUNTER — Encounter (HOSPITAL_COMMUNITY): Payer: Self-pay

## 2019-12-27 ENCOUNTER — Emergency Department (HOSPITAL_COMMUNITY)
Admission: EM | Admit: 2019-12-27 | Discharge: 2019-12-27 | Disposition: A | Discharge status: Home / Self Care | Payer: Medicare Other | Attending: Emergency Medicine | Admitting: Emergency Medicine

## 2019-12-27 DIAGNOSIS — Z79899 Other long term (current) drug therapy: Secondary | ICD-10-CM | POA: Diagnosis not present

## 2019-12-27 DIAGNOSIS — W06XXXA Fall from bed, initial encounter: Secondary | ICD-10-CM | POA: Insufficient documentation

## 2019-12-27 DIAGNOSIS — F17210 Nicotine dependence, cigarettes, uncomplicated: Secondary | ICD-10-CM | POA: Diagnosis not present

## 2019-12-27 DIAGNOSIS — Z7982 Long term (current) use of aspirin: Secondary | ICD-10-CM | POA: Insufficient documentation

## 2019-12-27 DIAGNOSIS — I2510 Atherosclerotic heart disease of native coronary artery without angina pectoris: Secondary | ICD-10-CM | POA: Insufficient documentation

## 2019-12-27 DIAGNOSIS — Z951 Presence of aortocoronary bypass graft: Secondary | ICD-10-CM | POA: Insufficient documentation

## 2019-12-27 DIAGNOSIS — S0101XA Laceration without foreign body of scalp, initial encounter: Secondary | ICD-10-CM | POA: Diagnosis not present

## 2019-12-27 DIAGNOSIS — S0990XA Unspecified injury of head, initial encounter: Secondary | ICD-10-CM | POA: Diagnosis present

## 2019-12-27 NOTE — ED Provider Notes (Signed)
[facility] EMERGENCY DEPARTMENT Provider Note   CSN: [id number] Arrival date & time: 12/27/19  1317     History Chief Complaint  Patient presents with  . Head Injury    Mark Potts is a 69 y.o. male presenting for evaluation of head injury.  Patient states he fell out of bed around 4:00 this morning.  He did hit his head on the nightstand.  He initially bleeding, but this resolved with pressure.  He reports a minor headache all day, which is not changed or worsened.  He has not taken anything for it including Tylenol ibuprofen.  He denies vision changes, slurred speech, numbness, lightheadedness, neck pain, back pain, nausea, vomiting, or difficulty walking.  He is not on blood thinners.  He takes only aspirin daily, does not take anything else including blood pressure medication.  He does not currently have a PCP, but is set to establish one soon.   HPI     Past Medical History:  Diagnosis Date  . CAD (coronary artery disease)    PCI of LCX and OM1  . Hyperlipemia   . NSTEMI (non-ST elevated myocardial infarction) [facility])     Patient Active Problem List   Diagnosis Date Noted  . CAD in native artery 06/22/2013  . NSTEMI (non-ST elevated myocardial infarction) (HCC) 06/20/2013  . Chest pain 06/19/2013  . Obesity, unspecified 06/19/2013  . Tobacco abuse 06/19/2013  . Other and unspecified hyperlipidemia 06/19/2013    Past Surgical History:  Procedure Laterality Date  . CORONARY ANGIOPLASTY WITH STENT PLACEMENT  06/21/13   PCI to mid LCX and OM1 with resolute DES  . LEFT HEART CATHETERIZATION WITH CORONARY ANGIOGRAM N/A 06/21/2013   Procedure: LEFT HEART CATHETERIZATION WITH CORONARY ANGIOGRAM;  Surgeon: Corky Crafts, MD;  Location: [facility] CATH LAB;  Service: Cardiovascular;  Laterality: N/A;       Family History  Problem Relation Age of Onset  . Heart disease Other        No family history  . Cancer Brother     Social History   Tobacco  Use  . Smoking status: Current Every Day Smoker    Packs/day: 1.00    Types: Cigarettes  . Smokeless tobacco: Never Used  Substance Use Topics  . Alcohol use: No    Alcohol/week: 0.0 standard drinks  . Drug use: No    Home Medications Prior to Admission medications   Medication Sig Start Date End Date Taking? Authorizing Provider  acetaminophen (TYLENOL) 325 MG tablet Take 650 mg by mouth every 6 (six) hours as needed for mild pain or headache.    [provider]  aspirin 81 MG chewable tablet Chew 1 tablet (81 mg total) by mouth daily. 06/22/13   Dhungel, Theda Belfast, MD  atorvastatin (LIPITOR) 80 MG tablet Take 1 tablet (80 mg total) by mouth daily. An appt is needed for future refills. Please call the office to schedule. 11/08/16   Lewayne Bunting, MD  metoprolol tartrate (LOPRESSOR) 25 MG tablet TAKE 1 TABLET BY MOUTH TWICE A DAY 11/12/16   Lewayne Bunting, MD  metoprolol tartrate (LOPRESSOR) 25 MG tablet TAKE 1 TABLET BY MOUTH TWICE A DAY 12/02/16   Crenshaw, Madolyn Frieze, MD  NITROSTAT 0.4 MG SL tablet PLACE 1 TABLET (0.4 MG TOTAL) UNDER THE TONGUE EVERY 5 (FIVE) MINUTES AS NEEDED FOR CHEST PAIN. 07/06/15   Lewayne Bunting, MD    Allergies    Patient has no known allergies.  Review of Systems  Review of Systems  Skin: Positive for wound.  Neurological: Positive for headaches.    Physical Exam Updated Vital Signs BP (!) 141/101   Pulse 98   Temp 98.2 F (36.8 C) (Oral)   Resp 18   SpO2 96%   Physical Exam Vitals and nursing note reviewed.  Constitutional:      General: He is not in acute distress.    Appearance: He is well-developed.     Comments: Resting in the chair no acute distress  HENT:     Head: Normocephalic.      Comments: Small hematoma of the posterior head with a 1 cm laceration without active bleeding.  No significant gaping.  Laceration is superficial. Cardiovascular:     Rate and Rhythm: Normal rate and regular rhythm.     Pulses: Normal  pulses.  Pulmonary:     Effort: Pulmonary effort is normal.  Abdominal:     General: There is no distension.  Musculoskeletal:        General: Normal range of motion.     Cervical back: Normal range of motion.  Skin:    General: Skin is warm.     Capillary Refill: Capillary refill takes less than 2 seconds.     Findings: No rash.  Neurological:     Mental Status: He is alert and oriented to person, place, and time.     ED Results / Procedures / Treatments   Labs (all labs ordered are listed, but only abnormal results are displayed) Labs Reviewed - No data to display  EKG None  Radiology No results found.  Procedures Procedures (including critical care time)  Medications Ordered in ED Medications - No data to display  ED Course  I have reviewed the triage vital signs and the nursing notes.  Pertinent labs & imaging results that were available during my care of the patient were reviewed by me and considered in my medical decision making (see chart for details).    MDM Rules/Calculators/A&P                          Patient presenting for evaluation of head injury.  On exam, he appears nontoxic.  He has a small superficial laceration of the posterior head without bleeding.  Underlying hematoma.  This occurred more than 12 hours ago, he reports no change in symptoms.  Discussed with patient that due to his age and risk for head bleed, recommend head CT, but patient declined.  Laceration is superficial, not gaping, not bleeding.  Does not require stitches or staples at this time.  Discussed wound care.  At this time, patient appears safe for discharge.  Return precautions given.  Patient states he understands and agrees to plan.  Final Clinical Impression(s) / ED Diagnoses Final diagnoses:  Injury of head, initial encounter  Laceration of scalp, initial encounter    Rx / DC Orders ED Discharge Orders    None       Alveria Apley, PA-C 12/27/19 Briant Sites, MD 12/28/19 1609

## 2019-12-27 NOTE — Discharge Instructions (Signed)
Wash your cut twice a day with soap and water. You may have minimal bleeding, use a Band-Aid or bandage as needed. Use Tylenol or ibuprofen as needed for pain. Use ice to with pain and swelling. Return to the emergency room immediately if you develop severe worsening headache, vision changes, vomiting, difficulty walking straight line, numbness, any new, worsening, concerning symptoms.

## 2019-12-27 NOTE — ED Triage Notes (Signed)
Pt reports falling out of bed this morning, hit the back of his head on the nightstand, laceration noted to the back of his head, bleeding controlled. Pt denies LOC, no blood thinners. No other injuries from fall.

## 2021-06-17 ENCOUNTER — Encounter (HOSPITAL_COMMUNITY)
Admission: EM | Disposition: A | Discharge status: Home / Self Care | Payer: Self-pay | Source: Home / Self Care | Attending: Cardiovascular Disease

## 2021-06-17 ENCOUNTER — Emergency Department (HOSPITAL_COMMUNITY): Payer: Medicare Other

## 2021-06-17 ENCOUNTER — Other Ambulatory Visit: Payer: Self-pay

## 2021-06-17 ENCOUNTER — Inpatient Hospital Stay (HOSPITAL_COMMUNITY)
Admission: EM | Admit: 2021-06-17 | Discharge: 2021-06-19 | DRG: 247 | Disposition: A | Discharge status: Home / Self Care | Payer: Medicare Other | Attending: Cardiovascular Disease | Admitting: Cardiovascular Disease

## 2021-06-17 DIAGNOSIS — E782 Mixed hyperlipidemia: Secondary | ICD-10-CM

## 2021-06-17 DIAGNOSIS — R739 Hyperglycemia, unspecified: Secondary | ICD-10-CM | POA: Diagnosis not present

## 2021-06-17 DIAGNOSIS — I2119 ST elevation (STEMI) myocardial infarction involving other coronary artery of inferior wall: Principal | ICD-10-CM | POA: Diagnosis present

## 2021-06-17 DIAGNOSIS — T82855A Stenosis of coronary artery stent, initial encounter: Secondary | ICD-10-CM | POA: Diagnosis present

## 2021-06-17 DIAGNOSIS — I2510 Atherosclerotic heart disease of native coronary artery without angina pectoris: Secondary | ICD-10-CM | POA: Diagnosis present

## 2021-06-17 DIAGNOSIS — Z720 Tobacco use: Secondary | ICD-10-CM | POA: Diagnosis not present

## 2021-06-17 DIAGNOSIS — I213 ST elevation (STEMI) myocardial infarction of unspecified site: Secondary | ICD-10-CM | POA: Diagnosis present

## 2021-06-17 DIAGNOSIS — F17210 Nicotine dependence, cigarettes, uncomplicated: Secondary | ICD-10-CM | POA: Diagnosis present

## 2021-06-17 DIAGNOSIS — Z809 Family history of malignant neoplasm, unspecified: Secondary | ICD-10-CM

## 2021-06-17 DIAGNOSIS — Z20822 Contact with and (suspected) exposure to covid-19: Secondary | ICD-10-CM | POA: Diagnosis present

## 2021-06-17 DIAGNOSIS — I440 Atrioventricular block, first degree: Secondary | ICD-10-CM | POA: Diagnosis present

## 2021-06-17 DIAGNOSIS — Z955 Presence of coronary angioplasty implant and graft: Principal | ICD-10-CM

## 2021-06-17 DIAGNOSIS — I10 Essential (primary) hypertension: Secondary | ICD-10-CM | POA: Diagnosis present

## 2021-06-17 DIAGNOSIS — Z79899 Other long term (current) drug therapy: Secondary | ICD-10-CM

## 2021-06-17 DIAGNOSIS — Z7982 Long term (current) use of aspirin: Secondary | ICD-10-CM

## 2021-06-17 DIAGNOSIS — I2111 ST elevation (STEMI) myocardial infarction involving right coronary artery: Secondary | ICD-10-CM

## 2021-06-17 DIAGNOSIS — E1165 Type 2 diabetes mellitus with hyperglycemia: Secondary | ICD-10-CM | POA: Diagnosis present

## 2021-06-17 DIAGNOSIS — E785 Hyperlipidemia, unspecified: Secondary | ICD-10-CM | POA: Diagnosis not present

## 2021-06-17 DIAGNOSIS — Y831 Surgical operation with implant of artificial internal device as the cause of abnormal reaction of the patient, or of later complication, without mention of misadventure at the time of the procedure: Secondary | ICD-10-CM | POA: Diagnosis present

## 2021-06-17 DIAGNOSIS — Z8249 Family history of ischemic heart disease and other diseases of the circulatory system: Secondary | ICD-10-CM | POA: Diagnosis not present

## 2021-06-17 HISTORY — PX: CORONARY/GRAFT ACUTE MI REVASCULARIZATION: CATH118305

## 2021-06-17 HISTORY — PX: LEFT HEART CATH AND CORONARY ANGIOGRAPHY: CATH118249

## 2021-06-17 LAB — BASIC METABOLIC PANEL
Anion gap: 10 (ref 5–15)
BUN: 9 mg/dL (ref 8–23)
CO2: 22 mmol/L (ref 22–32)
Calcium: 9.3 mg/dL (ref 8.9–10.3)
Chloride: 102 mmol/L (ref 98–111)
Creatinine, Ser: 1.07 mg/dL (ref 0.61–1.24)
GFR, Estimated: >60 mL/min (ref >60)
Glucose, Bld: 186 mg/dL — ABNORMAL HIGH (ref 70–99)
Potassium: 3.7 mmol/L (ref 3.5–5.1)
Sodium: 134 mmol/L — ABNORMAL LOW (ref 135–145)

## 2021-06-17 LAB — RESP PANEL BY RT-PCR (FLU A&B, COVID) ARPGX2
Influenza A by PCR: NEGATIVE
Influenza B by PCR: NEGATIVE
SARS Coronavirus 2 by RT PCR: NEGATIVE

## 2021-06-17 LAB — CBC
HCT: 47.3 % (ref 39.0–52.0)
Hemoglobin: 15.9 g/dL (ref 13.0–17.0)
MCH: 32.7 pg (ref 26.0–34.0)
MCHC: 33.6 g/dL (ref 30.0–36.0)
MCV: 97.3 fL (ref 80.0–100.0)
Platelets: 230 10*3/uL (ref 150–400)
RBC: 4.86 MIL/uL (ref 4.22–5.81)
RDW: 13.2 % (ref 11.5–15.5)
WBC: 9.7 10*3/uL (ref 4.0–10.5)
nRBC: 0 % (ref 0.0–0.2)

## 2021-06-17 LAB — MRSA NEXT GEN BY PCR, NASAL: MRSA by PCR Next Gen: NOT DETECTED

## 2021-06-17 LAB — LIPID PANEL
Cholesterol: 222 mg/dL — ABNORMAL HIGH (ref 0–200)
HDL: 43 mg/dL (ref >40)
LDL Cholesterol: 134 mg/dL — ABNORMAL HIGH (ref 0–99)
Total CHOL/HDL Ratio: 5.2 RATIO
Triglycerides: 227 mg/dL — ABNORMAL HIGH (ref <150)
VLDL: 45 mg/dL — ABNORMAL HIGH (ref 0–40)

## 2021-06-17 LAB — POCT ACTIVATED CLOTTING TIME
Activated Clotting Time: 263 seconds
Activated Clotting Time: 275 seconds

## 2021-06-17 LAB — TROPONIN I (HIGH SENSITIVITY)
Troponin I (High Sensitivity): 4299 ng/L (ref <18)
Troponin I (High Sensitivity): 11007 ng/L (ref <18)

## 2021-06-17 LAB — APTT: aPTT: 26 seconds (ref 24–36)

## 2021-06-17 LAB — PROTIME-INR
INR: 1 (ref 0.8–1.2)
Prothrombin Time: 13.1 seconds (ref 11.4–15.2)

## 2021-06-17 LAB — HEMOGLOBIN A1C
Hgb A1c MFr Bld: 6.1 % — ABNORMAL HIGH (ref 4.8–5.6)
Mean Plasma Glucose: 128.37 mg/dL

## 2021-06-17 LAB — HIV ANTIBODY (ROUTINE TESTING W REFLEX): HIV Screen 4th Generation wRfx: NONREACTIVE

## 2021-06-17 LAB — GLUCOSE, CAPILLARY
Glucose-Capillary: 177 mg/dL — ABNORMAL HIGH (ref 70–99)
Glucose-Capillary: 113 mg/dL — ABNORMAL HIGH (ref 70–99)

## 2021-06-17 SURGERY — CORONARY/GRAFT ACUTE MI REVASCULARIZATION
Anesthesia: LOCAL

## 2021-06-17 MED ORDER — SODIUM CHLORIDE 0.9 % IV SOLN: INTRAVENOUS | Status: DC

## 2021-06-17 MED ORDER — NITROGLYCERIN 1 MG/10 ML FOR IR/CATH LAB
INTRA_ARTERIAL | Status: AC
  Filled 2021-06-17: qty 10

## 2021-06-17 MED ORDER — ASPIRIN 81 MG PO CHEW
324.0000 mg | CHEWABLE_TABLET | Freq: Once | ORAL | Status: AC
  Administered 2021-06-17: 324 mg via ORAL
  Filled 2021-06-17: qty 4

## 2021-06-17 MED ORDER — ASPIRIN 81 MG PO CHEW: 81.0000 mg | CHEWABLE_TABLET | Freq: Every day | ORAL | Status: DC

## 2021-06-17 MED ORDER — FENTANYL CITRATE (PF) 100 MCG/2ML IJ SOLN
INTRAMUSCULAR | Status: AC
  Filled 2021-06-17: qty 2

## 2021-06-17 MED ORDER — SODIUM CHLORIDE 0.9 % WEIGHT BASED INFUSION
1.0000 mL/kg/h | INTRAVENOUS | Status: AC
  Administered 2021-06-17: 1 mL/kg/h via INTRAVENOUS

## 2021-06-17 MED ORDER — MIDAZOLAM HCL 2 MG/2ML IJ SOLN
INTRAMUSCULAR | Status: DC | PRN
  Administered 2021-06-17: 2 mg via INTRAVENOUS

## 2021-06-17 MED ORDER — METOPROLOL TARTRATE 5 MG/5ML IV SOLN
5.0000 mg | Freq: Once | INTRAVENOUS | Status: AC
  Administered 2021-06-17: 5 mg via INTRAVENOUS
  Filled 2021-06-17: qty 5

## 2021-06-17 MED ORDER — HEPARIN SODIUM (PORCINE) 5000 UNIT/ML IJ SOLN
4000.0000 [IU] | Freq: Once | INTRAMUSCULAR | Status: AC
  Administered 2021-06-17: 4000 [IU] via INTRAVENOUS
  Filled 2021-06-17: qty 1

## 2021-06-17 MED ORDER — SODIUM CHLORIDE 0.9 % IV SOLN
INTRAVENOUS | Status: AC | PRN
  Administered 2021-06-17: 100 mL/h via INTRAVENOUS
  Administered 2021-06-17: 999 mL/h via INTRAVENOUS

## 2021-06-17 MED ORDER — SODIUM CHLORIDE 0.9% FLUSH: 3.0000 mL | INTRAVENOUS | Status: DC | PRN

## 2021-06-17 MED ORDER — HEPARIN (PORCINE) 25000 UT/250ML-% IV SOLN
1000.0000 [IU]/h | INTRAVENOUS | Status: DC
  Filled 2021-06-17: qty 250

## 2021-06-17 MED ORDER — SALINE SPRAY 0.65 % NA SOLN
1.0000 | NASAL | Status: DC | PRN
  Filled 2021-06-17: qty 44

## 2021-06-17 MED ORDER — LABETALOL HCL 5 MG/ML IV SOLN: 10.0000 mg | INTRAVENOUS | Status: AC | PRN

## 2021-06-17 MED ORDER — SODIUM CHLORIDE 0.9% FLUSH
3.0000 mL | Freq: Two times a day (BID) | INTRAVENOUS | Status: DC
  Administered 2021-06-18: 3 mL via INTRAVENOUS

## 2021-06-17 MED ORDER — ACETAMINOPHEN 325 MG PO TABS: 650.0000 mg | ORAL_TABLET | Freq: Four times a day (QID) | ORAL | Status: DC | PRN

## 2021-06-17 MED ORDER — ONDANSETRON HCL 4 MG/2ML IJ SOLN: 4.0000 mg | Freq: Four times a day (QID) | INTRAMUSCULAR | Status: DC | PRN

## 2021-06-17 MED ORDER — HEPARIN (PORCINE) IN NACL 1000-0.9 UT/500ML-% IV SOLN
INTRAVENOUS | Status: DC | PRN
  Administered 2021-06-17 (×2): 500 mL

## 2021-06-17 MED ORDER — HEPARIN SODIUM (PORCINE) 1000 UNIT/ML IJ SOLN
INTRAMUSCULAR | Status: AC
  Filled 2021-06-17: qty 10

## 2021-06-17 MED ORDER — ATORVASTATIN CALCIUM 80 MG PO TABS
80.0000 mg | ORAL_TABLET | Freq: Every day | ORAL | Status: DC
  Administered 2021-06-17 – 2021-06-18 (×2): 80 mg via ORAL
  Filled 2021-06-17 (×2): qty 1

## 2021-06-17 MED ORDER — VERAPAMIL HCL 2.5 MG/ML IV SOLN
INTRAVENOUS | Status: DC | PRN
  Administered 2021-06-17: 10 mL via INTRA_ARTERIAL

## 2021-06-17 MED ORDER — TICAGRELOR 90 MG PO TABS
ORAL_TABLET | ORAL | Status: DC | PRN
  Administered 2021-06-17: 180 mg via ORAL

## 2021-06-17 MED ORDER — ASPIRIN EC 81 MG PO TBEC
81.0000 mg | DELAYED_RELEASE_TABLET | Freq: Every day | ORAL | Status: DC
  Administered 2021-06-18 – 2021-06-19 (×2): 81 mg via ORAL
  Filled 2021-06-17 (×2): qty 1

## 2021-06-17 MED ORDER — NITROGLYCERIN IN D5W 200-5 MCG/ML-% IV SOLN
0.0000 ug/min | INTRAVENOUS | Status: DC
  Filled 2021-06-17: qty 250

## 2021-06-17 MED ORDER — INSULIN ASPART 100 UNIT/ML IJ SOLN
0.0000 [IU] | Freq: Three times a day (TID) | INTRAMUSCULAR | Status: DC
  Administered 2021-06-17: 2 [IU] via SUBCUTANEOUS
  Administered 2021-06-18 – 2021-06-19 (×2): 1 [IU] via SUBCUTANEOUS

## 2021-06-17 MED ORDER — MIDAZOLAM HCL 2 MG/2ML IJ SOLN
INTRAMUSCULAR | Status: AC
  Filled 2021-06-17: qty 2

## 2021-06-17 MED ORDER — HYDRALAZINE HCL 20 MG/ML IJ SOLN: 10.0000 mg | INTRAMUSCULAR | Status: AC | PRN

## 2021-06-17 MED ORDER — NITROGLYCERIN 1 MG/10 ML FOR IR/CATH LAB
INTRA_ARTERIAL | Status: DC | PRN
  Administered 2021-06-17 (×2): 150 ug via INTRACORONARY

## 2021-06-17 MED ORDER — HEPARIN SODIUM (PORCINE) 1000 UNIT/ML IJ SOLN
INTRAMUSCULAR | Status: DC | PRN
  Administered 2021-06-17: 3000 [IU] via INTRAVENOUS
  Administered 2021-06-17: 6000 [IU] via INTRAVENOUS

## 2021-06-17 MED ORDER — SODIUM CHLORIDE 0.9 % IV SOLN: 250.0000 mL | INTRAVENOUS | Status: DC | PRN

## 2021-06-17 MED ORDER — FENTANYL CITRATE (PF) 100 MCG/2ML IJ SOLN
INTRAMUSCULAR | Status: DC | PRN
  Administered 2021-06-17: 25 ug via INTRAVENOUS

## 2021-06-17 MED ORDER — HEPARIN (PORCINE) IN NACL 1000-0.9 UT/500ML-% IV SOLN
INTRAVENOUS | Status: AC
  Filled 2021-06-17: qty 500

## 2021-06-17 MED ORDER — METOPROLOL TARTRATE 25 MG PO TABS
25.0000 mg | ORAL_TABLET | Freq: Two times a day (BID) | ORAL | Status: DC
  Administered 2021-06-17 – 2021-06-19 (×5): 25 mg via ORAL
  Filled 2021-06-17 (×5): qty 1

## 2021-06-17 MED ORDER — NITROGLYCERIN 0.4 MG SL SUBL: 0.4000 mg | SUBLINGUAL_TABLET | SUBLINGUAL | Status: DC | PRN

## 2021-06-17 MED ORDER — TICAGRELOR 90 MG PO TABS
90.0000 mg | ORAL_TABLET | Freq: Two times a day (BID) | ORAL | Status: DC
  Administered 2021-06-17 – 2021-06-19 (×4): 90 mg via ORAL
  Filled 2021-06-17 (×4): qty 1

## 2021-06-17 MED ORDER — ENOXAPARIN SODIUM 40 MG/0.4ML IJ SOSY
40.0000 mg | PREFILLED_SYRINGE | INTRAMUSCULAR | Status: DC
  Administered 2021-06-17 – 2021-06-18 (×2): 40 mg via SUBCUTANEOUS
  Filled 2021-06-17 (×2): qty 0.4

## 2021-06-17 MED ORDER — LIDOCAINE HCL (PF) 1 % IJ SOLN
INTRAMUSCULAR | Status: DC | PRN
  Administered 2021-06-17: 2 mL via INTRADERMAL

## 2021-06-17 MED ORDER — IOHEXOL 350 MG/ML SOLN
INTRAVENOUS | Status: DC | PRN
  Administered 2021-06-17: 130 mL via INTRA_ARTERIAL

## 2021-06-17 MED ORDER — TICAGRELOR 90 MG PO TABS
ORAL_TABLET | ORAL | Status: AC
  Filled 2021-06-17: qty 2

## 2021-06-17 SURGICAL SUPPLY — 21 items
BALL SAPPHIRE NC24 2.75X22 (BALLOONS) ×2
BALL SAPPHIRE NC24 3.5X15 (BALLOONS) ×2
BALLN SAPPHIRE 2.5X15 (BALLOONS) ×2
BALLOON SAPPHIRE 2.5X15 (BALLOONS) IMPLANT
BALLOON SAPPHIRE NC24 2.75X22 (BALLOONS) IMPLANT
BALLOON SAPPHIRE NC24 3.5X15 (BALLOONS) IMPLANT
CATH 5FR JL3.5 JR4 ANG PIG MP (CATHETERS) ×1 IMPLANT
CATH LAUNCHER 6FR JR4 (CATHETERS) ×1 IMPLANT
DEVICE RAD COMP TR BAND LRG (VASCULAR PRODUCTS) ×1 IMPLANT
GLIDESHEATH SLEND SS 6F .021 (SHEATH) ×1 IMPLANT
GUIDEWIRE INQWIRE 1.5J.035X260 (WIRE) IMPLANT
INQWIRE 1.5J .035X260CM (WIRE) ×2
KIT ENCORE 26 ADVANTAGE (KITS) ×1 IMPLANT
KIT HEART LEFT (KITS) ×2 IMPLANT
PACK CARDIAC CATHETERIZATION (CUSTOM PROCEDURE TRAY) ×2 IMPLANT
STENT ONYX FRONTIER 2.5X30 (Permanent Stent) ×1 IMPLANT
STENT ONYX FRONTIER 3.5X18 (Permanent Stent) ×1 IMPLANT
SYR MEDRAD MARK 7 150ML (SYRINGE) ×2 IMPLANT
TRANSDUCER W/STOPCOCK (MISCELLANEOUS) ×2 IMPLANT
TUBING CIL FLEX 10 FLL-RA (TUBING) ×2 IMPLANT
WIRE COUGAR XT STRL 190CM (WIRE) ×1 IMPLANT

## 2021-06-17 NOTE — Progress Notes (Signed)
?   06/17/21 1620  ?Clinical Encounter Type  ?Visited With Patient;Health care provider;Family ?(Mr. Mark Potts (son), daughter-in-law, and Patient's brother.)  ?Visit Type Critical Care;Post-op;Follow-up  ?Referral From Chaplain ?(Observed family in 2H/CATH LAB waiting Area.)  ?Consult/Referral To Chaplain ?Gelene Mink [place])  ?Spiritual Encounters  ?Spiritual Needs Emotional  ? ?While passing through 2H/CATH LAB waiting area observed patient's family (Son, Mr. Mark Potts, daughter-in-law, and brother). In conversation learned that [name] family were waiting to see him. Confirmed with patient's nurse and [name] that it was appropriate time for visitor's. Escorted son and brother into 2H12 and offered hospitality.  ?[address] [place] [phone]. ?

## 2021-06-17 NOTE — Progress Notes (Signed)
ANTICOAGULATION CONSULT NOTE - Initial Consult ? ?Pharmacy Consult for Heparin ?Indication:  STEMI ? ?No Known Allergies ? ?Patient Measurements: ?Height: 5\' 6"  (167.6 cm) ?Weight: 86.6 kg (191 lb) ?IBW/kg (Calculated) : 63.8 ?Heparin Dosing Weight: 81.8 kg ? ?Vital Signs: ?Temp: 95 ?F (35 ?C) (04/09 1316) ?Temp Source: Oral (04/09 1316) ?BP: 154/129 (04/09 1345) ?Pulse Rate: 121 (04/09 1345) ? ?Labs: ?Recent Labs  ?  06/17/21 ?1319  ?HGB 15.9  ?HCT 47.3  ?PLT 230  ? ? ?CrCl cannot be calculated (Patient's most recent lab result is older than the maximum 21 days allowed.). ? ? ?Medical History: ?Past Medical History:  ?Diagnosis Date  ? CAD (coronary artery disease)   ? PCI of LCX and OM1  ? Hyperlipemia   ? NSTEMI (non-ST elevated myocardial infarction) (HCC)   ? ? ?Medications:  ?(Not in a hospital admission) ? ?Scheduled:  ? metoprolol tartrate  5 mg Intravenous Once  ? ?Infusions:  ? sodium chloride 20 mL/hr at 06/17/21 1327  ? heparin    ? nitroGLYCERIN    ? ?PRN:  ? ?Assessment: ?40 yom with a history of CAD, HLD, NSTEMI. Patient is presenting with STEMI. Heparin per pharmacy consult placed for  STEMI . ? ?Patient is not on anticoagulation prior to arrival. ? ?Hgb 15.9; plt 230 ? ?Goal of Therapy:  ?Heparin level 0.3-0.7 units/ml ?Monitor platelets by anticoagulation protocol: Yes ?  ?Plan:  ?Give 4000 units bolus x 1 ?Start heparin infusion at 1000 units/hr ?Check anti-Xa level in 6 hours and daily while on heparin ?Continue to monitor H&H and platelets ? ?78, PharmD, BCPS ?06/17/2021 2:00 PM ?ED Clinical Pharmacist -  [phone] ? ?  ?

## 2021-06-17 NOTE — Progress Notes (Incomplete)
Date and time results received: 06/17/21 1819 ?(use smartphrase ".now" to insert current time) ? ?Test: Troponin ?Critical Value: 11,007 ? ?Name of Provider Notified: Dr. Tonny Bollman and Ronne Binning, RN ? ?Orders Received? Or Actions Taken?:  continued monitoring ?

## 2021-06-17 NOTE — ED Provider Notes (Signed)
?[facility] EMERGENCY DEPARTMENT ?Provider Note ? ? ?CSN: [id number] ?Arrival date & time: 06/17/21  1303 ? ?  ? ?History ? ?Chief Complaint  ?Patient presents with  ? Shoulder Pain  ? ? ?Mark Potts is a 70 y.o. male. ? ?HPI ? ?  ? ?71yo male with history of CAD, hyperlipidemia who presents with concern for left shoulder pain. ? ?Wednesday began to develop shoulder pain and "elbow pain" with some radiation into the chest. Has been coming and going over the last few days. Spontaneously coming and going, not exertional.  Feels different than prior MI. Not worse with lifting arm or moving arm.  Last night, pain became more severe, couldn't sleep, lasted fom 12-4AM.  Denies associated dyspnea, nausea, vomiting, lightheadedness, leg swelling. No hx of DVT/PE, no recent immobilizatiion or surgeries.  No abdominal pain, cough, fever.  Pain was severe all night but eased off now just mild aching pain in left shoulder, and then on short reeval reports no continuing pain.  Has not seen Dr. Jens [facility] since 2017.  ? ? ? ?Past Medical History:  ?Diagnosis Date  ? CAD (coronary artery disease)   ? PCI of LCX and OM1  ? Hyperlipemia   ? NSTEMI (non-ST elevated myocardial infarction) (HCC)   ?  ? ?Home Medications ?Prior to Admission medications   ?Medication Sig Start Date End Date Taking? Authorizing Provider  ?aspirin 81 MG chewable tablet Chew 1 tablet (81 mg total) by mouth daily. 06/22/13  Yes Dhungel, Nishant, MD  ?acetaminophen (TYLENOL) 325 MG tablet Take 650 mg by mouth every 6 (six) hours as needed for mild pain or headache. ?Patient not taking: Reported on 06/17/2021    [provider]  ?atorvastatin (LIPITOR) 80 MG tablet Take 1 tablet (80 mg total) by mouth daily. An appt is needed for future refills. Please call the office to schedule. ?Patient not taking: Reported on 06/17/2021 11/08/16   Lewayne Bunting, MD  ?metoprolol tartrate (LOPRESSOR) 25 MG tablet TAKE 1 TABLET BY MOUTH  TWICE A DAY ?Patient not taking: Reported on 06/17/2021 11/12/16   Lewayne Bunting, MD  ?metoprolol tartrate (LOPRESSOR) 25 MG tablet TAKE 1 TABLET BY MOUTH TWICE A DAY ?Patient not taking: Reported on 06/17/2021 12/02/16   Lewayne Bunting, MD  ?NITROSTAT 0.4 MG SL tablet PLACE 1 TABLET (0.4 MG TOTAL) UNDER THE TONGUE EVERY 5 (FIVE) MINUTES AS NEEDED FOR CHEST PAIN. ?Patient not taking: Reported on 06/17/2021 07/06/15   Lewayne Bunting, MD  ?   ? ?Allergies    ?Patient has no known allergies.   ? ?Review of Systems   ?Review of Systems ?See above ?Physical Exam ?Updated Vital Signs ?BP 131/87   Pulse 86   Temp 98.4 ?F (36.9 ?C) (Oral)   Resp 15   Ht 5\' 6"  (1.676 m)   Wt 86.6 kg   SpO2 97%   BMI 30.83 kg/m?  ?Physical Exam ?Vitals and nursing note reviewed.  ?Constitutional:   ?   General: He is not in acute distress. ?   Appearance: He is well-developed. He is not diaphoretic.  ?HENT:  ?   Head: Normocephalic and atraumatic.  ?Eyes:  ?   Conjunctiva/sclera: Conjunctivae normal.  ?Cardiovascular:  ?   Rate and Rhythm: Normal rate and regular rhythm.  ?   Heart sounds: Normal heart sounds. No murmur heard. ?  No friction rub. No gallop.  ?Pulmonary:  ?   Effort: Pulmonary effort is normal. No respiratory  distress.  ?   Breath sounds: Normal breath sounds. No wheezing or rales.  ?Abdominal:  ?   General: There is no distension.  ?   Palpations: Abdomen is soft.  ?   Tenderness: There is no abdominal tenderness. There is no guarding.  ?Musculoskeletal:  ?   Cervical back: Normal range of motion.  ?Skin: ?   General: Skin is warm and dry.  ?Neurological:  ?   Mental Status: He is alert and oriented to person, place, and time.  ? ? ?ED Results / Procedures / Treatments   ?Labs ?(all labs ordered are listed, but only abnormal results are displayed) ?Labs Reviewed  ?BASIC METABOLIC PANEL - Abnormal; Notable for the following components:  ?    Result Value  ? Sodium 134 (*)   ? Glucose, Bld 186 (*)   ? All other  components within normal limits  ?HEMOGLOBIN A1C - Abnormal; Notable for the following components:  ? Hgb A1c MFr Bld 6.1 (*)   ? All other components within normal limits  ?LIPID PANEL - Abnormal; Notable for the following components:  ? Cholesterol 222 (*)   ? Triglycerides 227 (*)   ? VLDL 45 (*)   ? LDL Cholesterol 134 (*)   ? All other components within normal limits  ?GLUCOSE, CAPILLARY - Abnormal; Notable for the following components:  ? Glucose-Capillary 177 (*)   ? All other components within normal limits  ?GLUCOSE, CAPILLARY - Abnormal; Notable for the following components:  ? Glucose-Capillary 113 (*)   ? All other components within normal limits  ?TROPONIN I (HIGH SENSITIVITY) - Abnormal; Notable for the following components:  ? Troponin I (High Sensitivity) 4,299 (*)   ? All other components within normal limits  ?TROPONIN I (HIGH SENSITIVITY) - Abnormal; Notable for the following components:  ? Troponin I (High Sensitivity) 11,007 (*)   ? All other components within normal limits  ?RESP PANEL BY RT-PCR (FLU A&B, COVID) ARPGX2  ?MRSA NEXT GEN BY PCR, NASAL  ?MRSA NEXT GEN BY PCR, NASAL  ?CBC  ?PROTIME-INR  ?APTT  ?HIV ANTIBODY (ROUTINE TESTING W REFLEX)  ?BASIC METABOLIC PANEL  ?CBC  ?POCT ACTIVATED CLOTTING TIME  ?POCT ACTIVATED CLOTTING TIME  ? ? ?EKG ?EKG Interpretation ? ?Date/Time:  Sunday June 17 2021 13:13:48 EDT ?Ventricular Rate:  115 ?PR Interval:  144 ?QRS Duration: 101 ?QT Interval:  341 ?QTC Calculation: 472 ?R Axis:   103 ?Text Interpretation: Sinus or ectopic atrial tachycardia Right axis deviation Nonspecific repol abnormality, diffuse leads ST elevation, consider inferior injury New STE in comparison to? prior Confirmed by Alvira Monday ([id number]) on 06/17/2021 1:22:53 PM ? ?Radiology ?CARDIAC CATHETERIZATION ? ?Result Date: 06/17/2021 ?  Prox RCA lesion is 80% stenosed.   Dist RCA lesion is 99% stenosed.   Prox Cx to Dist Cx lesion is 25% stenosed.   Prox LAD lesion is 90% stenosed.   A  drug-eluting stent was successfully placed using a STENT ONYX FRONTIER 3.5X18.   A drug-eluting stent was successfully placed using a STENT ONYX FRONTIER 2.5X30.   Post intervention, there is a 0% residual stenosis.   Post intervention, there is a 0% residual stenosis. 1.  Acute inferior STEMI secondary to subtotal occlusion of the distal RCA, 2 RCA lesions treated successfully with PCI using a 2.5 x 30 mm Onyx frontier DES in the distal lesion and a 3.5 x 18 mm Onyx frontier DES in the proximal lesion 2.  Continued patency of the stented segment  in the left circumflex with mild diffuse in-stent restenosis but no obstructive disease 3.  Severe mid LAD stenosis at the origin of the first diagonal 4.  Mild LV systolic dysfunction with basal mid inferior hypokinesis, LVEF estimated at 50 to 55% consistent with his acute inferior STEMI Recommendations: Aspirin and ticagrelor x12 months without interruption Tobacco cessation counseling Post MI medical therapy, check 2D echo Staged PCI of the LAD to treat severe 90% stenosis and a focal area at the origin of a small first diagonal branch  ? ?DG Chest Port 1 View ? ?Result Date: 06/17/2021 ?CLINICAL DATA:  Chest pain. EXAM: PORTABLE CHEST 1 VIEW COMPARISON:  06/19/2013 FINDINGS: Cardiac silhouette is normal in size. No mediastinal or hilar masses. Clear lungs.  No convincing pleural effusion.  No pneumothorax. Skeletal structures are grossly intact. IMPRESSION: No active disease. Electronically Signed   By: Amie Portland M.D.   On: 06/17/2021 13:52   ? ?Procedures ?Marland KitchenCritical Care ?Performed by: Alvira Monday, MD ?Authorized by: Alvira Monday, MD  ? ?Critical care provider statement:  ?  Critical care time (minutes):  30 ?  Critical care was time spent personally by me on the following activities:  Development of treatment plan with patient or surrogate, discussions with consultants, evaluation of patient's response to treatment, examination of patient, ordering and  review of laboratory studies, ordering and review of radiographic studies, ordering and performing treatments and interventions, pulse oximetry, re-evaluation of patient's condition and review of old charts  ? ? ?Medicatio

## 2021-06-17 NOTE — ED Triage Notes (Signed)
Pt c/o left shoulder and elbow pain that started on Wednesday. States on Friday night the pain started going into his chest. Movement does not make his shoulder or elbow worse. ?

## 2021-06-17 NOTE — Progress Notes (Signed)
1355 Delice Bison, EDRN called to give report. ?1520 - Veronica, Cath Lab called report.  All questions answered. ?1530 - Pt arrived from Cath Lab.  Pt placed on 2H12 monitor, cleaned with CHG, Mepilex placed on sacrum, and old linens removed. ?1600 - Pt assessed. ?1700 - Dr. Excell Seltzer paged d/t no post-cath orders.  Dr. Excell Seltzer placed the orders. ?1710 - 3 mL of air removed from TR band.  No bleeding noted. ?1725 - 3 mL of air removed from TR band.  No bleeding noted. ?1730 - Report given to Tres Arroyos, Charity fundraiser.  All questions answered.   ?1740 - Slight oozing noted, no air removed.  Pt instructed to keep that arm still.  Pt placed in position of comfort. ?

## 2021-06-17 NOTE — Progress Notes (Signed)
?  06/17/21 1323  ?Clinical Encounter Type  ?Visited With Patient;Health care provider  ?Visit Type ED;Critical Care;Code;Pre-op;Initial ?(Code STEMI)  ?Referral From Nurse  ?Consult/Referral To Chaplain  ? ?Responded to page in E.D. Room 13 for Code STEMI. Upon arrival met with Mr. Mark Potts at patient's bedside. Chaplain introduced self and provided empathetic and compassionate opportunity for [name] opportunity to share concerns. Patient stated that his "brother is outside in the truck." Chaplain offered to escort his brother to the waiting area if he chose, but patient said that his brother preferred to remain there. Patient receiving care by medical staff at this time in preparation for STEMI. Staff will page Chaplain upon request of patient or family.  ?Chaplain Rhylei Mcquaig, M.Min., [phone].   ?

## 2021-06-17 NOTE — H&P (Addendum)
?Cardiology Admission History and Physical:  ? ?Patient ID: Mark Potts ?MRN: [id number]; DOB: [birth year]  ? ?Admission date: 06/17/2021 ? ?PCP:  [facility] ?  ?[facility] ?Cardiologist:  Jens Som ? ? ?Chief Complaint:  Chest pain/inferior STMEMI ? ?HPI:  ? ?Mark Potts is a 71 y.o. male with CAD and tobacco use who is being seen 06/17/2021 for the evaluation of chest pain/inferior STEMI at request of Dr. Dalene Seltzer in the ER. ? ?71 y/o smoker with HTN/HL and CAD. Had NSTEMI 06/19/13; cath revealed 70 Lcx, 80 OM, 75 small posterolateral; EF 60; treated with stenting of the circumflex  and OM  (2.5 x 76mm and 2.5 x 29mm), EF 60%. ? ?Widely patent  left main coronary artery. ? Widely patent  left anterior descending artery and its branches. ? 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 ? Widely patent right coronary artery.  Very small vessel disease in the posterior lateral artery. ?Normal left ventricular systolic function.  LVEDP  24 mmHg.  Ejection fraction  60%  ? ?Last saw Dr. Jens Som in 2017.  ? ?Reports pain in left shoulder and elbow since Thursday. Spread to hsi chest yesterday. Has been on/off overnight. Called 911 today. ECG with inferior ST elevation. Treated with ASA and heparin. Currently CP free SBP 150. HR 100-110  ? ?Smoking 1 ppd. No recent bleeding. Compliant with ASA.  Hstrop 4,299  A1c 6.1 ? ?Review of Systems: [y] = yes, [ ]  = no   ?   ?General: Weight gain [] ; Weight loss [ ] ; Anorexia [ ] ; Fatigue [ ] ; Fever [ ] ; Chills [ ] ; Weakness [ ]    ?Cardiac: Chest pain/pressure [place]ienne ]; Resting SOB [ y]; Exertional SOB [y]; Orthopnea [ ] ; Pedal Edema [] ; Palpitations [ ] ; Syncope [ ] ; Presyncope [ ] ; Paroxysmal nocturnal dyspnea[ ]    ?Pulmonary: Cough [ ] ; Wheezing[ ] ; Hemoptysis[ ] ; Sputum [ ] ; Snoring [ ]    ?GI: Vomiting[ ] ; Dysphagia[ ] ; Melena[ ] ; Hematochezia [ ] ; Heartburn[ ] ; Abdominal pain [ ] ; Constipation [ ] ; Diarrhea  [ ] ; BRBPR [ ]    ?GU: Hematuria[ ] ; Dysuria [ ] ; Nocturia[ ]  ?Vascular: Pain in legs with walking [ ] ; Pain in feet with lying flat [ ] ; Non-healing sores [ ] ; Stroke [ ] ; TIA [ ] ; Slurred speech [ ] ;   ?Neuro: Headaches[ ] ; Vertigo[ ] ; Seizures[ ] ; Paresthesias[ ] ;Blurred vision [ ] ; Diplopia [ ] ; Vision changes [ ]    ?Ortho/Skin: Arthritis [y]; Joint pain [y]; Muscle pain [ ] ; Joint swelling [ ] ; Back Pain [ ] ; Rash [ ]    ?Psych: Depression[ ] ; Anxiety[ ]    ?Heme: Bleeding problems [ ] ; Clotting disorders [ ] ; Anemia [ ]    ?Endocrine: Diabetes [ ] ; Thyroid dysfunction[ ]  ? ? ? ?Past Medical History:  ?Diagnosis Date  ? CAD (coronary artery disease)   ? PCI of LCX and OM1  ? Hyperlipemia   ? NSTEMI (non-ST elevated myocardial infarction) (HCC)   ? ? ?Past Surgical History:  ?Procedure Laterality Date  ? CORONARY ANGIOPLASTY WITH STENT PLACEMENT  06/21/13  ? PCI to mid LCX and OM1 with resolute DES  ? LEFT HEART CATHETERIZATION WITH CORONARY ANGIOGRAM N/A 06/21/2013  ? Procedure: LEFT HEART CATHETERIZATION WITH CORONARY ANGIOGRAM;  Surgeon: Corky Crafts, MD;  Location: [facility] CATH LAB;  Service: Cardiovascular;  Laterality: N/A;  ?  ? ?Medications Prior to Admission: ?Prior to Admission medications   ?Medication Sig Start Date End  Date Taking? Authorizing Provider  ?acetaminophen (TYLENOL) 325 MG tablet Take 650 mg by mouth every 6 (six) hours as needed for mild pain or headache.    [provider]  ?aspirin 81 MG chewable tablet Chew 1 tablet (81 mg total) by mouth daily. 06/22/13   Dhungel, Theda Belfast, MD  ?atorvastatin (LIPITOR) 80 MG tablet Take 1 tablet (80 mg total) by mouth daily. An appt is needed for future refills. Please call the office to schedule. 11/08/16   Lewayne Bunting, MD  ?metoprolol tartrate (LOPRESSOR) 25 MG tablet TAKE 1 TABLET BY MOUTH TWICE A DAY 11/12/16   Lewayne Bunting, MD  ?metoprolol tartrate (LOPRESSOR) 25 MG tablet TAKE 1 TABLET BY MOUTH TWICE A DAY 12/02/16   Lewayne Bunting, MD  ?NITROSTAT 0.4 MG SL tablet PLACE 1 TABLET (0.4 MG TOTAL) UNDER THE TONGUE EVERY 5 (FIVE) MINUTES AS NEEDED FOR CHEST PAIN. 07/06/15   Lewayne Bunting, MD  ?  ? ?Allergies:   No Known Allergies ? ?Social History:   ?Social History  ? ?Socioeconomic History  ? Marital status: Divorced  ?  Spouse name: Not on file  ? Number of children: 2  ? Years of education: Not on file  ? Highest education level: Not on file  ?Occupational History  ?  Employer: [employer]  ?Tobacco Use  ? Smoking status: Every Day  ?  Packs/day: 1.00  ?  Types: Cigarettes  ? Smokeless tobacco: Never  ?Substance and Sexual Activity  ? Alcohol use: No  ?  Alcohol/week: 0.0 standard drinks  ? Drug use: No  ? Sexual activity: Not on file  ?Other Topics Concern  ? Not on file  ?Social History Narrative  ? Not on file  ? ?Social Determinants of Health  ? ?Financial Resource Strain: Not on file  ?Food Insecurity: Not on file  ?Transportation Needs: Not on file  ?Physical Activity: Not on file  ?Stress: Not on file  ?Social Connections: Not on file  ?Intimate Partner Violence: Not on file  ?  ?Family History:   ?The patient's family history includes Cancer in his brother; Heart disease in an other family member.   ? ? ?Physical Exam/Data:  ? ?Vitals:  ? 06/17/21 1313 06/17/21 1315 06/17/21 1316 06/17/21 1345  ?BP:  (!) 154/99 (!) 154/99 (!) 154/129  ?Pulse:   (!) 115 (!) 121  ?Resp:  15 (!) 118 16  ?Temp:   (!) 95 ?F (35 ?C)   ?TempSrc:   Oral   ?SpO2:   95% 96%  ?Weight: 86.6 kg     ?Height: 5\' 6"  (1.676 m)     ? ?No intake or output data in the 24 hours ending 06/17/21 1411 ? ?  06/17/2021  ?  1:13 PM 06/16/2015  ?  7:45 AM 06/16/2014  ?  8:32 AM  ?Last 3 Weights  ?Weight (lbs) 191 lb 191 lb 193 lb 11.2 oz  ?Weight (kg) 86.637 kg 86.637 kg 87.862 kg  ?   ?Body mass index is 30.83 kg/m?.  ?General:  Sitting up in bed No resp difficulty ?HEENT: normal ?Neck: supple. no JVD. Carotids 2+ bilat; no bruits. No lymphadenopathy or thryomegaly  appreciated. ?Cor: PMI nondisplaced. Regular tachycardia No rubs, gallops or murmurs. ?Lungs: clear but decreased throguhout ?Abdomen: soft, nontender, nondistended. No hepatosplenomegaly. No bruits or masses. Good bowel sounds. ?Extremities: no cyanosis, clubbing, rash, edema ?Neuro: alert & orientedx3, cranial nerves grossly intact. moves all 4 extremities w/o difficulty. Affect  pleasant ? ? ?EKG:  NSR with inferior ST elevation II,III, F ? ?Relevant CV Studies: ? ?Cath 06/21/13 ?Widely patent  left main coronary artery. ? Widely patent  left anterior descending artery and its branches. ? 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 ? Widely patent right coronary artery.  Very small vessel disease in the posterior lateral artery. ?Normal left ventricular systolic function.  LVEDP  24 mmHg.  Ejection fraction  60% ? ?Laboratory Data: ? ?High Sensitivity Troponin:  No results for input(s): TROPONINIHS in the last 720 hours.    ?ChemistryNo results for input(s): NA, K, CL, CO2, GLUCOSE, BUN, CREATININE, CALCIUM, MG, GFRNONAA, GFRAA, ANIONGAP in the last 168 hours.  ?No results for input(s): PROT, ALBUMIN, AST, ALT, ALKPHOS, BILITOT in the last 168 hours. ?Lipids No results for input(s): CHOL, TRIG, HDL, LABVLDL, LDLCALC, CHOLHDL in the last 168 hours. ?Hematology ?Recent Labs  ?Lab 06/17/21 ?1319  ?WBC 9.7  ?RBC 4.86  ?HGB 15.9  ?HCT 47.3  ?MCV 97.3  ?MCH 32.7  ?MCHC 33.6  ?RDW 13.2  ?PLT 230  ? ?Thyroid No results for input(s): TSH, FREET4 in the last 168 hours. ?BNPNo results for input(s): BNP, PROBNP in the last 168 hours.  ?DDimer No results for input(s): DDIMER in the last 168 hours. ? ? ?Radiology/Studies:  ?DG Chest Port 1 View ? ?Result Date: 06/17/2021 ?CLINICAL DATA:  Chest pain. EXAM: PORTABLE CHEST 1 VIEW COMPARISON:  06/19/2013 FINDINGS: Cardiac silhouette is normal in size. No mediastinal or hilar masses. Clear lungs.  No convincing pleural effusion.  No pneumothorax. Skeletal  structures are grossly intact. IMPRESSION: No active disease. Electronically Signed   By: Amie Portland M.D.   On: 06/17/2021 13:52   ? ? ?Assessment and Plan:  ? ?1. CAD with inferior STEMI ?- h/o CAD wit

## 2021-06-18 ENCOUNTER — Encounter (HOSPITAL_COMMUNITY)
Admission: EM | Disposition: A | Discharge status: Home / Self Care | Payer: Self-pay | Source: Home / Self Care | Attending: Cardiovascular Disease

## 2021-06-18 ENCOUNTER — Inpatient Hospital Stay (HOSPITAL_COMMUNITY): Payer: Medicare Other

## 2021-06-18 ENCOUNTER — Encounter (HOSPITAL_COMMUNITY): Payer: Self-pay | Admitting: Cardiovascular Disease

## 2021-06-18 ENCOUNTER — Other Ambulatory Visit (HOSPITAL_COMMUNITY): Payer: Self-pay

## 2021-06-18 DIAGNOSIS — I2119 ST elevation (STEMI) myocardial infarction involving other coronary artery of inferior wall: Secondary | ICD-10-CM

## 2021-06-18 DIAGNOSIS — E782 Mixed hyperlipidemia: Secondary | ICD-10-CM

## 2021-06-18 HISTORY — PX: CORONARY STENT INTERVENTION: CATH118234

## 2021-06-18 LAB — BASIC METABOLIC PANEL
Anion gap: 7 (ref 5–15)
BUN: 9 mg/dL (ref 8–23)
CO2: 23 mmol/L (ref 22–32)
Calcium: 8.8 mg/dL — ABNORMAL LOW (ref 8.9–10.3)
Chloride: 107 mmol/L (ref 98–111)
Creatinine, Ser: 1.02 mg/dL (ref 0.61–1.24)
GFR, Estimated: >60 mL/min (ref >60)
Glucose, Bld: 140 mg/dL — ABNORMAL HIGH (ref 70–99)
Potassium: 3.6 mmol/L (ref 3.5–5.1)
Sodium: 137 mmol/L (ref 135–145)

## 2021-06-18 LAB — ECHOCARDIOGRAM COMPLETE
AR max vel: 1.98 cm2
AV Area VTI: 1.81 cm2
AV Area mean vel: 1.85 cm2
AV Mean grad: 3 mmHg
AV Peak grad: 6.6 mmHg
Ao pk vel: 1.28 m/s
Area-P 1/2: 3.77 cm2
Height: 66 in
S' Lateral: 2.8 cm
Weight: 3054.69 oz

## 2021-06-18 LAB — CBC
HCT: 42.7 % (ref 39.0–52.0)
Hemoglobin: 14.5 g/dL (ref 13.0–17.0)
MCH: 33.1 pg (ref 26.0–34.0)
MCHC: 34 g/dL (ref 30.0–36.0)
MCV: 97.5 fL (ref 80.0–100.0)
Platelets: 206 10*3/uL (ref 150–400)
RBC: 4.38 MIL/uL (ref 4.22–5.81)
RDW: 13.3 % (ref 11.5–15.5)
WBC: 10.3 10*3/uL (ref 4.0–10.5)
nRBC: 0 % (ref 0.0–0.2)

## 2021-06-18 LAB — GLUCOSE, CAPILLARY
Glucose-Capillary: 120 mg/dL — ABNORMAL HIGH (ref 70–99)
Glucose-Capillary: 103 mg/dL — ABNORMAL HIGH (ref 70–99)
Glucose-Capillary: 145 mg/dL — ABNORMAL HIGH (ref 70–99)
Glucose-Capillary: 116 mg/dL — ABNORMAL HIGH (ref 70–99)

## 2021-06-18 LAB — POCT ACTIVATED CLOTTING TIME: Activated Clotting Time: 311 seconds

## 2021-06-18 SURGERY — CORONARY STENT INTERVENTION
Anesthesia: LOCAL

## 2021-06-18 MED ORDER — VERAPAMIL HCL 2.5 MG/ML IV SOLN: INTRAVENOUS | Status: DC | PRN

## 2021-06-18 MED ORDER — HEPARIN (PORCINE) IN NACL 1000-0.9 UT/500ML-% IV SOLN
INTRAVENOUS | Status: AC
  Filled 2021-06-18: qty 1000

## 2021-06-18 MED ORDER — SODIUM CHLORIDE 0.9% FLUSH
3.0000 mL | Freq: Two times a day (BID) | INTRAVENOUS | Status: DC
  Administered 2021-06-18: 3 mL via INTRAVENOUS

## 2021-06-18 MED ORDER — LABETALOL HCL 5 MG/ML IV SOLN: 10.0000 mg | INTRAVENOUS | Status: AC | PRN

## 2021-06-18 MED ORDER — HYDRALAZINE HCL 20 MG/ML IJ SOLN: 10.0000 mg | INTRAMUSCULAR | Status: AC | PRN

## 2021-06-18 MED ORDER — LIDOCAINE HCL (PF) 1 % IJ SOLN
INTRAMUSCULAR | Status: DC | PRN
  Administered 2021-06-18: 2 mL

## 2021-06-18 MED ORDER — SODIUM CHLORIDE 0.9 % IV SOLN
250.0000 mL | INTRAVENOUS | Status: DC | PRN
Start: 2021-06-18 — End: 2021-06-19

## 2021-06-18 MED ORDER — FENTANYL CITRATE (PF) 100 MCG/2ML IJ SOLN
INTRAMUSCULAR | Status: AC
  Filled 2021-06-18: qty 2

## 2021-06-18 MED ORDER — SODIUM CHLORIDE 0.9 % WEIGHT BASED INFUSION
1.0000 mL/kg/h | INTRAVENOUS | Status: AC
  Administered 2021-06-18: 1 mL/kg/h via INTRAVENOUS

## 2021-06-18 MED ORDER — MIDAZOLAM HCL 2 MG/2ML IJ SOLN
INTRAMUSCULAR | Status: DC | PRN
  Administered 2021-06-18: 2 mg via INTRAVENOUS
  Administered 2021-06-18: 1 mg via INTRAVENOUS

## 2021-06-18 MED ORDER — LIDOCAINE HCL (PF) 1 % IJ SOLN
INTRAMUSCULAR | Status: AC
  Filled 2021-06-18: qty 30

## 2021-06-18 MED ORDER — HEPARIN (PORCINE) IN NACL 1000-0.9 UT/500ML-% IV SOLN
INTRAVENOUS | Status: DC | PRN
  Administered 2021-06-18 (×2): 500 mL

## 2021-06-18 MED ORDER — FENTANYL CITRATE (PF) 100 MCG/2ML IJ SOLN
INTRAMUSCULAR | Status: DC | PRN
  Administered 2021-06-18 (×2): 25 ug via INTRAVENOUS

## 2021-06-18 MED ORDER — CHLORHEXIDINE GLUCONATE CLOTH 2 % EX PADS
6.0000 | MEDICATED_PAD | Freq: Every day | CUTANEOUS | Status: DC
  Administered 2021-06-18 – 2021-06-19 (×2): 6 via TOPICAL

## 2021-06-18 MED ORDER — MIDAZOLAM HCL 2 MG/2ML IJ SOLN
INTRAMUSCULAR | Status: AC
  Filled 2021-06-18: qty 2

## 2021-06-18 MED ORDER — NITROGLYCERIN 1 MG/10 ML FOR IR/CATH LAB
INTRA_ARTERIAL | Status: DC | PRN
  Administered 2021-06-18 (×2): 150 ug via INTRA_ARTERIAL

## 2021-06-18 MED ORDER — SODIUM CHLORIDE 0.9% FLUSH: 3.0000 mL | INTRAVENOUS | Status: DC | PRN

## 2021-06-18 MED ORDER — NITROGLYCERIN 1 MG/10 ML FOR IR/CATH LAB
INTRA_ARTERIAL | Status: AC
  Filled 2021-06-18: qty 10

## 2021-06-18 MED ORDER — IOHEXOL 350 MG/ML SOLN
INTRAVENOUS | Status: DC | PRN
  Administered 2021-06-18: 70 mL

## 2021-06-18 MED ORDER — HEPARIN SODIUM (PORCINE) 1000 UNIT/ML IJ SOLN
INTRAMUSCULAR | Status: DC | PRN
  Administered 2021-06-18: 9000 [IU] via INTRAVENOUS

## 2021-06-18 SURGICAL SUPPLY — 18 items
BALL SAPPHIRE NC24 2.75X8 (BALLOONS) ×2
BALLN SAPPHIRE 2.0X12 (BALLOONS) ×2
BALLOON SAPPHIRE 2.0X12 (BALLOONS) IMPLANT
BALLOON SAPPHIRE NC24 2.75X8 (BALLOONS) IMPLANT
CATH VISTA GUIDE 6FR XBLAD3.5 (CATHETERS) ×1 IMPLANT
DEVICE RAD COMP TR BAND LRG (VASCULAR PRODUCTS) ×1 IMPLANT
GLIDESHEATH SLEND SS 6F .021 (SHEATH) ×1 IMPLANT
GUIDEWIRE INQWIRE 1.5J.035X260 (WIRE) IMPLANT
INQWIRE 1.5J .035X260CM (WIRE) ×2
KIT ENCORE 26 ADVANTAGE (KITS) ×1 IMPLANT
KIT HEART LEFT (KITS) ×2 IMPLANT
KIT HEMO VALVE WATCHDOG (MISCELLANEOUS) ×1 IMPLANT
PACK CARDIAC CATHETERIZATION (CUSTOM PROCEDURE TRAY) ×2 IMPLANT
STENT SYNERGY XD 2.50X16 (Permanent Stent) IMPLANT
SYNERGY XD 2.50X16 (Permanent Stent) ×2 IMPLANT
TRANSDUCER W/STOPCOCK (MISCELLANEOUS) ×2 IMPLANT
TUBING CIL FLEX 10 FLL-RA (TUBING) ×2 IMPLANT
WIRE COUGAR XT STRL 190CM (WIRE) ×1 IMPLANT

## 2021-06-18 NOTE — Progress Notes (Signed)
? ?Progress Note ? ?Patient Name: Mark Potts ?Date of Encounter: 06/18/2021 ? ?Primary Cardiologist: Dr. Jens Som ? ?Subjective  ? ?No recurrent chest pain, anxiously awaiting staged PCI to LAD today following inferior STEMI yesterday ? ?Inpatient Medications  ?  ?Scheduled Meds: ? aspirin EC  81 mg Oral Daily  ? atorvastatin  80 mg Oral QHS  ? Chlorhexidine Gluconate Cloth  6 each Topical Daily  ? enoxaparin (LOVENOX) injection  40 mg Subcutaneous Q24H  ? insulin aspart  0-9 Units Subcutaneous TID WC  ? metoprolol tartrate  25 mg Oral BID  ? sodium chloride flush  3 mL Intravenous Q12H  ? ticagrelor  90 mg Oral BID  ? ?Continuous Infusions: ? sodium chloride Stopped (06/17/21 2200)  ? sodium chloride    ? nitroGLYCERIN Stopped (06/17/21 1537)  ? ?PRN Meds: ?sodium chloride, acetaminophen, nitroGLYCERIN, ondansetron (ZOFRAN) IV, sodium chloride, sodium chloride flush  ? ?Vital Signs  ?  ?Vitals:  ? 06/18/21 0500 06/18/21 0600 06/18/21 0700 06/18/21 0801  ?BP: (!) 144/85 117/75 (!) 135/114   ?Pulse: 76 76 78   ?Resp: 17 14 13    ?Temp:    97.8 ?F (36.6 ?C)  ?TempSrc:    Oral  ?SpO2: 94% 95% 96%   ?Weight:      ?Height:      ? ? ?Intake/Output Summary (Last 24 hours) at 06/18/2021 0836 ?Last data filed at 06/18/2021 0600 ?Gross per 24 hour  ?Intake 1110.33 ml  ?Output 1200 ml  ?Net -89.67 ml  ? ? ?I/O since admission: -87 ? ?Filed Weights  ? 06/17/21 1313  ?Weight: 86.6 kg  ? ? ?Telemetry  ?  ?Sinus rhythm in upper 80s to 90s- Personally Reviewed ? ?ECG  ?  ?06/18/2021 ECG (independently read by me): Sinus rhythm with first-degree AV block, PR interval 2 1 2  ms.  Small inferior Q-wave 3 aVF with mild T wave inversion ? ?06/17/2021 ECG (independently read by me): Sinus rhythm at 93 with first-degree AV block, PR interval 248 ms.  0.5 to 1 mm J-point elevation inferiorly with T wave inversion ? ?Physical Exam  ? ? ?BP (!) 135/114   Pulse 78   Temp 97.8 ?F (36.6 ?C) (Oral)   Resp 13   Ht 5\' 6"  (1.676 m)   Wt 86.6 kg    SpO2 96%   BMI 30.83 kg/m?  ?General: Alert, oriented, no distress.  ?Skin: normal turgor, no rashes, warm and dry ?HEENT: Normocephalic, atraumatic. Pupils equal round and reactive to light; sclera anicteric; extraocular muscles intact;  ?Nose without nasal septal hypertrophy ?Mouth/Parynx benign; Mallinpatti scale 3 ?Neck: No JVD, no carotid bruits; normal carotid upstroke ?Lungs: clear to ausculatation and percussion; no wheezing or rales ?Chest wall: without tenderness to palpitation ?Heart: PMI not displaced, RRR, s1 s2 normal, 1/6 systolic murmur, no diastolic murmur, no rubs, gallops, thrills, or heaves ?Abdomen: soft, nontender; no hepatosplenomehaly, BS+; abdominal aorta nontender and not dilated by palpation. ?Back: no CVA tenderness ?Pulses 2+ right radial cath site stable ?Musculoskeletal: full range of motion, normal strength, no joint deformities ?Extremities: no clubbing cyanosis or edema, Homan's sign negative  ?Neurologic: grossly nonfocal; Cranial nerves grossly wnl ?Psychologic: Normal mood and affect ? ? ?Labs  ?  ?Chemistry ?Recent Labs  ?Lab 06/17/21 ?1319 06/18/21 ?0031  ?NA 134* 137  ?K 3.7 3.6  ?CL 102 107  ?CO2 22 23  ?GLUCOSE 186* 140*  ?BUN 9 9  ?CREATININE 1.07 1.02  ?CALCIUM 9.3 8.8*  ?GFRNONAA >60 >60  ?  ANIONGAP 10 7  ?  ? ?Hematology ?Recent Labs  ?Lab 06/17/21 ?1319 06/18/21 ?0031  ?WBC 9.7 10.3  ?RBC 4.86 4.38  ?HGB 15.9 14.5  ?HCT 47.3 42.7  ?MCV 97.3 97.5  ?MCH 32.7 33.1  ?MCHC 33.6 34.0  ?RDW 13.2 13.3  ?PLT 230 206  ? ?HS troponin: 4299 > 11,007 ? ?Cardiac EnzymesNo results for input(s): TROPONINI in the last 168 hours. No results for input(s): TROPIPOC in the last 168 hours.  ? ?BNPNo results for input(s): BNP, PROBNP in the last 168 hours.  ? ?DDimer No results for input(s): DDIMER in the last 168 hours.  ? ?Lipid Panel  ?   ?Component Value Date/Time  ? CHOL 222 (H) 06/17/2021 1324  ? TRIG 227 (H) 06/17/2021 1324  ? HDL 43 06/17/2021 1324  ? CHOLHDL 5.2 06/17/2021 1324  ?  VLDL 45 (H) 06/17/2021 1324  ? LDLCALC 134 (H) 06/17/2021 1324  ?  ? ?Radiology  ?  ?CARDIAC CATHETERIZATION ? ?Result Date: 06/17/2021 ?  Prox RCA lesion is 80% stenosed.   Dist RCA lesion is 99% stenosed.   Prox Cx to Dist Cx lesion is 25% stenosed.   Prox LAD lesion is 90% stenosed.   A drug-eluting stent was successfully placed using a STENT ONYX FRONTIER 3.5X18.   A drug-eluting stent was successfully placed using a STENT ONYX FRONTIER 2.5X30.   Post intervention, there is a 0% residual stenosis.   Post intervention, there is a 0% residual stenosis. 1.  Acute inferior STEMI secondary to subtotal occlusion of the distal RCA, 2 RCA lesions treated successfully with PCI using a 2.5 x 30 mm Onyx frontier DES in the distal lesion and a 3.5 x 18 mm Onyx frontier DES in the proximal lesion 2.  Continued patency of the stented segment in the left circumflex with mild diffuse in-stent restenosis but no obstructive disease 3.  Severe mid LAD stenosis at the origin of the first diagonal 4.  Mild LV systolic dysfunction with basal mid inferior hypokinesis, LVEF estimated at 50 to 55% consistent with his acute inferior STEMI Recommendations: Aspirin and ticagrelor x12 months without interruption Tobacco cessation counseling Post MI medical therapy, check 2D echo Staged PCI of the LAD to treat severe 90% stenosis and a focal area at the origin of a small first diagonal branch  ? ?DG Chest Port 1 View ? ?Result Date: 06/17/2021 ?CLINICAL DATA:  Chest pain. EXAM: PORTABLE CHEST 1 VIEW COMPARISON:  06/19/2013 FINDINGS: Cardiac silhouette is normal in size. No mediastinal or hilar masses. Clear lungs.  No convincing pleural effusion.  No pneumothorax. Skeletal structures are grossly intact. IMPRESSION: No active disease. Electronically Signed   By: Amie Portland M.D.   On: 06/17/2021 13:52   ? ?Cardiac Studies  ? ?  Prox RCA lesion is 80% stenosed. ?  Dist RCA lesion is 99% stenosed. ?  Prox Cx to Dist Cx lesion is 25% stenosed. ?   Prox LAD lesion is 90% stenosed. ?  A drug-eluting stent was successfully placed using a STENT ONYX FRONTIER 3.5X18. ?  A drug-eluting stent was successfully placed using a STENT ONYX FRONTIER 2.5X30. ?  Post intervention, there is a 0% residual stenosis. ?  Post intervention, there is a 0% residual stenosis. ?  ?1.  Acute inferior STEMI secondary to subtotal occlusion of the distal RCA, 2 RCA lesions treated successfully with PCI using a 2.5 x 30 mm Onyx frontier DES in the distal lesion and a 3.5 x 18  mm Onyx frontier DES in the proximal lesion ?2.  Continued patency of the stented segment in the left circumflex with mild diffuse in-stent restenosis but no obstructive disease ?3.  Severe mid LAD stenosis at the origin of the first diagonal ?4.  Mild LV systolic dysfunction with basal mid inferior hypokinesis, LVEF estimated at 50 to 55% consistent with his acute inferior STEMI ?  ?Recommendations:  ?Aspirin and ticagrelor x12 months without interruption ?Tobacco cessation counseling ?Post MI medical therapy, check 2D echo ?Staged PCI of the LAD to treat severe 90% stenosis and a focal area at the origin of a small first diagonal branch ? ?Intervention ? ? ? ?Patient Profile  ?   ?71 y.o. male with a longstanding tobacco history and prior LCx PCI 2015 who presented with several days of stuttering chest pain and was found to have subtotal RCA's stenoses with stenting of a mid and distal vessel with plans for staged PCI to LAD today ? ?Assessment & Plan  ?  ?Inferior STEMI: Patient presented with several days of stuttering chest pain.  He was found to have 80% mid and 99% distal RCA stenosis before the PDA takeoff and underwent successful DES stenting x2.  Plan is for staged PCI to 90% proximal LAD stenosis in the region of the small first diagonal takeoff.  Continue DAPT for minimum of 1 year preferably longer.  Patient had not been on any medication.  He was started on metoprolol 25 mg twice a day yesterday.  We  will titrate depending upon blood pressure and heart rate following his intervention today.  Plan for 2D echo Doppler today.  Also following PCI today, most likely initiate ARB therapy tomorrow post MI  ?Lon

## 2021-06-18 NOTE — Interval H&P Note (Signed)
Cath Lab Visit (complete for each Cath Lab visit) ? ?Clinical Evaluation Leading to the Procedure:  ? ?ACS: Yes.   ? ?Non-ACS:   ? ?Anginal Classification: CCS IV ? ?Anti-ischemic medical therapy: Minimal Therapy (1 class of medications) ? ?Non-Invasive Test Results: No non-invasive testing performed ? ?Prior CABG: No previous CABG ? ? ? ? ? ?History and Physical Interval Note: ? ?06/18/2021 ?11:16 AM ? ?Mark Potts  has presented today for surgery, with the diagnosis of CAD.  The various methods of treatment have been discussed with the patient and family. After consideration of risks, benefits and other options for treatment, the patient has consented to  Procedure(s): ?CORONARY STENT INTERVENTION (N/A) as a surgical intervention.  The patient's history has been reviewed, patient examined, no change in status, stable for surgery.  I have reviewed the patient's chart and labs.  Questions were answered to the patient's satisfaction.   ? ? ?Sherren Mocha ? ? ?

## 2021-06-18 NOTE — TOC Benefit Eligibility Note (Signed)
Patient Advocate Encounter ? ?Insurance verification completed.   ? ?The patient is currently admitted and upon discharge could be taking Brilinta 90 mg. ? ?The current 30 day co-pay is, $0.00.  ? ?The patient is insured through [employer] Medicare Part D  ? ? ? ?Makensie Mulhall, CPhT ?Pharmacy Patient Advocate Specialist ?[facility] Patient Advocate Team ?Direct Number: [phone]  Fax: [phone] ? ? ? ? ? ?  ?

## 2021-06-18 NOTE — Progress Notes (Signed)
Echocardiogram ?2D Echocardiogram has been performed. ? ?Mark Potts ?06/18/2021, 2:51 PM ?

## 2021-06-18 NOTE — H&P (View-Only) (Signed)
? ?Progress Note ? ?Patient Name: Mark Potts ?Date of Encounter: 06/18/2021 ? ?Primary Cardiologist: Dr. Jens Som ? ?Subjective  ? ?No recurrent chest pain, anxiously awaiting staged PCI to LAD today following inferior STEMI yesterday ? ?Inpatient Medications  ?  ?Scheduled Meds: ? aspirin EC  81 mg Oral Daily  ? atorvastatin  80 mg Oral QHS  ? Chlorhexidine Gluconate Cloth  6 each Topical Daily  ? enoxaparin (LOVENOX) injection  40 mg Subcutaneous Q24H  ? insulin aspart  0-9 Units Subcutaneous TID WC  ? metoprolol tartrate  25 mg Oral BID  ? sodium chloride flush  3 mL Intravenous Q12H  ? ticagrelor  90 mg Oral BID  ? ?Continuous Infusions: ? sodium chloride Stopped (06/17/21 2200)  ? sodium chloride    ? nitroGLYCERIN Stopped (06/17/21 1537)  ? ?PRN Meds: ?sodium chloride, acetaminophen, nitroGLYCERIN, ondansetron (ZOFRAN) IV, sodium chloride, sodium chloride flush  ? ?Vital Signs  ?  ?Vitals:  ? 06/18/21 0500 06/18/21 0600 06/18/21 0700 06/18/21 0801  ?BP: (!) 144/85 117/75 (!) 135/114   ?Pulse: 76 76 78   ?Resp: 17 14 13    ?Temp:    97.8 ?F (36.6 ?C)  ?TempSrc:    Oral  ?SpO2: 94% 95% 96%   ?Weight:      ?Height:      ? ? ?Intake/Output Summary (Last 24 hours) at 06/18/2021 0836 ?Last data filed at 06/18/2021 0600 ?Gross per 24 hour  ?Intake 1110.33 ml  ?Output 1200 ml  ?Net -89.67 ml  ? ? ?I/O since admission: -87 ? ?Filed Weights  ? 06/17/21 1313  ?Weight: 86.6 kg  ? ? ?Telemetry  ?  ?Sinus rhythm in upper 80s to 90s- Personally Reviewed ? ?ECG  ?  ?06/18/2021 ECG (independently read by me): Sinus rhythm with first-degree AV block, PR interval 2 1 2  ms.  Small inferior Q-wave 3 aVF with mild T wave inversion ? ?06/17/2021 ECG (independently read by me): Sinus rhythm at 93 with first-degree AV block, PR interval 248 ms.  0.5 to 1 mm J-point elevation inferiorly with T wave inversion ? ?Physical Exam  ? ? ?BP (!) 135/114   Pulse 78   Temp 97.8 ?F (36.6 ?C) (Oral)   Resp 13   Ht 5\' 6"  (1.676 m)   Wt 86.6 kg    SpO2 96%   BMI 30.83 kg/m?  ?General: Alert, oriented, no distress.  ?Skin: normal turgor, no rashes, warm and dry ?HEENT: Normocephalic, atraumatic. Pupils equal round and reactive to light; sclera anicteric; extraocular muscles intact;  ?Nose without nasal septal hypertrophy ?Mouth/Parynx benign; Mallinpatti scale 3 ?Neck: No JVD, no carotid bruits; normal carotid upstroke ?Lungs: clear to ausculatation and percussion; no wheezing or rales ?Chest wall: without tenderness to palpitation ?Heart: PMI not displaced, RRR, s1 s2 normal, 1/6 systolic murmur, no diastolic murmur, no rubs, gallops, thrills, or heaves ?Abdomen: soft, nontender; no hepatosplenomehaly, BS+; abdominal aorta nontender and not dilated by palpation. ?Back: no CVA tenderness ?Pulses 2+ right radial cath site stable ?Musculoskeletal: full range of motion, normal strength, no joint deformities ?Extremities: no clubbing cyanosis or edema, Homan's sign negative  ?Neurologic: grossly nonfocal; Cranial nerves grossly wnl ?Psychologic: Normal mood and affect ? ? ?Labs  ?  ?Chemistry ?Recent Labs  ?Lab 06/17/21 ?1319 06/18/21 ?0031  ?NA 134* 137  ?K 3.7 3.6  ?CL 102 107  ?CO2 22 23  ?GLUCOSE 186* 140*  ?BUN 9 9  ?CREATININE 1.07 1.02  ?CALCIUM 9.3 8.8*  ?GFRNONAA >60 >60  ?  ANIONGAP 10 7  ?  ? ?Hematology ?Recent Labs  ?Lab 06/17/21 ?1319 06/18/21 ?0031  ?WBC 9.7 10.3  ?RBC 4.86 4.38  ?HGB 15.9 14.5  ?HCT 47.3 42.7  ?MCV 97.3 97.5  ?MCH 32.7 33.1  ?MCHC 33.6 34.0  ?RDW 13.2 13.3  ?PLT 230 206  ? ?HS troponin: 4299 > 11,007 ? ?Cardiac EnzymesNo results for input(s): TROPONINI in the last 168 hours. No results for input(s): TROPIPOC in the last 168 hours.  ? ?BNPNo results for input(s): BNP, PROBNP in the last 168 hours.  ? ?DDimer No results for input(s): DDIMER in the last 168 hours.  ? ?Lipid Panel  ?   ?Component Value Date/Time  ? CHOL 222 (H) 06/17/2021 1324  ? TRIG 227 (H) 06/17/2021 1324  ? HDL 43 06/17/2021 1324  ? CHOLHDL 5.2 06/17/2021 1324  ?  VLDL 45 (H) 06/17/2021 1324  ? LDLCALC 134 (H) 06/17/2021 1324  ?  ? ?Radiology  ?  ?CARDIAC CATHETERIZATION ? ?Result Date: 06/17/2021 ?  Prox RCA lesion is 80% stenosed.   Dist RCA lesion is 99% stenosed.   Prox Cx to Dist Cx lesion is 25% stenosed.   Prox LAD lesion is 90% stenosed.   A drug-eluting stent was successfully placed using a STENT ONYX FRONTIER 3.5X18.   A drug-eluting stent was successfully placed using a STENT ONYX FRONTIER 2.5X30.   Post intervention, there is a 0% residual stenosis.   Post intervention, there is a 0% residual stenosis. 1.  Acute inferior STEMI secondary to subtotal occlusion of the distal RCA, 2 RCA lesions treated successfully with PCI using a 2.5 x 30 mm Onyx frontier DES in the distal lesion and a 3.5 x 18 mm Onyx frontier DES in the proximal lesion 2.  Continued patency of the stented segment in the left circumflex with mild diffuse in-stent restenosis but no obstructive disease 3.  Severe mid LAD stenosis at the origin of the first diagonal 4.  Mild LV systolic dysfunction with basal mid inferior hypokinesis, LVEF estimated at 50 to 55% consistent with his acute inferior STEMI Recommendations: Aspirin and ticagrelor x12 months without interruption Tobacco cessation counseling Post MI medical therapy, check 2D echo Staged PCI of the LAD to treat severe 90% stenosis and a focal area at the origin of a small first diagonal branch  ? ?DG Chest Port 1 View ? ?Result Date: 06/17/2021 ?CLINICAL DATA:  Chest pain. EXAM: PORTABLE CHEST 1 VIEW COMPARISON:  06/19/2013 FINDINGS: Cardiac silhouette is normal in size. No mediastinal or hilar masses. Clear lungs.  No convincing pleural effusion.  No pneumothorax. Skeletal structures are grossly intact. IMPRESSION: No active disease. Electronically Signed   By: David  Ormond M.D.   On: 06/17/2021 13:52   ? ?Cardiac Studies  ? ?  Prox RCA lesion is 80% stenosed. ?  Dist RCA lesion is 99% stenosed. ?  Prox Cx to Dist Cx lesion is 25% stenosed. ?   Prox LAD lesion is 90% stenosed. ?  A drug-eluting stent was successfully placed using a STENT ONYX FRONTIER 3.5X18. ?  A drug-eluting stent was successfully placed using a STENT ONYX FRONTIER 2.5X30. ?  Post intervention, there is a 0% residual stenosis. ?  Post intervention, there is a 0% residual stenosis. ?  ?1.  Acute inferior STEMI secondary to subtotal occlusion of the distal RCA, 2 RCA lesions treated successfully with PCI using a 2.5 x 30 mm Onyx frontier DES in the distal lesion and a 3.5 x 18   mm Onyx frontier DES in the proximal lesion ?2.  Continued patency of the stented segment in the left circumflex with mild diffuse in-stent restenosis but no obstructive disease ?3.  Severe mid LAD stenosis at the origin of the first diagonal ?4.  Mild LV systolic dysfunction with basal mid inferior hypokinesis, LVEF estimated at 50 to 55% consistent with his acute inferior STEMI ?  ?Recommendations:  ?Aspirin and ticagrelor x12 months without interruption ?Tobacco cessation counseling ?Post MI medical therapy, check 2D echo ?Staged PCI of the LAD to treat severe 90% stenosis and a focal area at the origin of a small first diagonal branch ? ?Intervention ? ? ? ?Patient Profile  ?   ?71 y.o. male with a longstanding tobacco history and prior LCx PCI 2015 who presented with several days of stuttering chest pain and was found to have subtotal RCA's stenoses with stenting of a mid and distal vessel with plans for staged PCI to LAD today ? ?Assessment & Plan  ?  ?Inferior STEMI: Patient presented with several days of stuttering chest pain.  He was found to have 80% mid and 99% distal RCA stenosis before the PDA takeoff and underwent successful DES stenting x2.  Plan is for staged PCI to 90% proximal LAD stenosis in the region of the small first diagonal takeoff.  Continue DAPT for minimum of 1 year preferably longer.  Patient had not been on any medication.  He was started on metoprolol 25 mg twice a day yesterday.  We  will titrate depending upon blood pressure and heart rate following his intervention today.  Plan for 2D echo Doppler today.  Also following PCI today, most likely initiate ARB therapy tomorrow post MI  ?Lon

## 2021-06-18 NOTE — Progress Notes (Signed)
?  Transition of Care (TOC) Screening Note ? ? ?Patient Details  ?Name: Mark Potts ?Date of Birth: Jan 20, 1951 ? ? ?Transition of Care (TOC) CM/SW Contact:    ?Harriet Masson, RN ?Phone Number: ?06/18/2021, 4:30 PM ? ? ? ?Transition of Care Department [facility]) has reviewed patient and no TOC needs have been identified at this time. We will continue to monitor patient advancement through interdisciplinary progression rounds. If new patient transition needs arise, please place a TOC consult. ? ? ?

## 2021-06-19 ENCOUNTER — Telehealth: Payer: Self-pay

## 2021-06-19 ENCOUNTER — Other Ambulatory Visit (HOSPITAL_COMMUNITY): Payer: Self-pay

## 2021-06-19 ENCOUNTER — Encounter: Payer: Self-pay | Admitting: *Deleted

## 2021-06-19 DIAGNOSIS — Z006 Encounter for examination for normal comparison and control in clinical research program: Secondary | ICD-10-CM

## 2021-06-19 LAB — COMPREHENSIVE METABOLIC PANEL
ALT: 33 U/L (ref 0–44)
AST: 39 U/L (ref 15–41)
Albumin: 3.2 g/dL — ABNORMAL LOW (ref 3.5–5.0)
Alkaline Phosphatase: 56 U/L (ref 38–126)
Anion gap: 8 (ref 5–15)
BUN: 15 mg/dL (ref 8–23)
CO2: 20 mmol/L — ABNORMAL LOW (ref 22–32)
Calcium: 8.8 mg/dL — ABNORMAL LOW (ref 8.9–10.3)
Chloride: 110 mmol/L (ref 98–111)
Creatinine, Ser: 0.96 mg/dL (ref 0.61–1.24)
GFR, Estimated: >60 mL/min (ref >60)
Glucose, Bld: 103 mg/dL — ABNORMAL HIGH (ref 70–99)
Potassium: 4 mmol/L (ref 3.5–5.1)
Sodium: 138 mmol/L (ref 135–145)
Total Bilirubin: 1 mg/dL (ref 0.3–1.2)
Total Protein: 6.5 g/dL (ref 6.5–8.1)

## 2021-06-19 LAB — CBC
HCT: 40.3 % (ref 39.0–52.0)
Hemoglobin: 13.7 g/dL (ref 13.0–17.0)
MCH: 32.9 pg (ref 26.0–34.0)
MCHC: 34 g/dL (ref 30.0–36.0)
MCV: 96.6 fL (ref 80.0–100.0)
Platelets: 214 10*3/uL (ref 150–400)
RBC: 4.17 MIL/uL — ABNORMAL LOW (ref 4.22–5.81)
RDW: 13.3 % (ref 11.5–15.5)
WBC: 10.8 10*3/uL — ABNORMAL HIGH (ref 4.0–10.5)
nRBC: 0 % (ref 0.0–0.2)

## 2021-06-19 LAB — GLUCOSE, CAPILLARY
Glucose-Capillary: 104 mg/dL — ABNORMAL HIGH (ref 70–99)
Glucose-Capillary: 142 mg/dL — ABNORMAL HIGH (ref 70–99)

## 2021-06-19 MED ORDER — ASPIRIN 81 MG PO TBEC
81.0000 mg | DELAYED_RELEASE_TABLET | Freq: Every day | ORAL | 2 refills | Status: AC
Start: 2021-06-20 — End: ?
  Filled 2021-06-19: qty 90, 90d supply, fill #0

## 2021-06-19 MED ORDER — ATORVASTATIN CALCIUM 80 MG PO TABS
80.0000 mg | ORAL_TABLET | Freq: Every day | ORAL | 1 refills | Status: DC
  Filled 2021-06-19: qty 90, 90d supply, fill #0

## 2021-06-19 MED ORDER — TICAGRELOR 90 MG PO TABS
90.0000 mg | ORAL_TABLET | Freq: Two times a day (BID) | ORAL | 2 refills | Status: DC
  Filled 2021-06-19: qty 180, 90d supply, fill #0

## 2021-06-19 MED ORDER — LOSARTAN POTASSIUM 25 MG PO TABS
12.5000 mg | ORAL_TABLET | Freq: Every day | ORAL | Status: DC
  Administered 2021-06-19: 12.5 mg via ORAL
  Filled 2021-06-19: qty 1

## 2021-06-19 MED ORDER — NITROGLYCERIN 0.4 MG SL SUBL
0.4000 mg | SUBLINGUAL_TABLET | SUBLINGUAL | 2 refills | Status: AC | PRN
Start: 2021-06-19 — End: ?
  Filled 2021-06-19: qty 25, 14d supply, fill #0

## 2021-06-19 MED ORDER — METOPROLOL TARTRATE 25 MG PO TABS
25.0000 mg | ORAL_TABLET | Freq: Two times a day (BID) | ORAL | 0 refills | Status: DC
Start: 2021-06-19 — End: 2021-07-02
  Filled 2021-06-19: qty 180, 90d supply, fill #0

## 2021-06-19 MED ORDER — NICOTINE 14 MG/24HR TD PT24
14.0000 mg | MEDICATED_PATCH | TRANSDERMAL | 0 refills | Status: DC
  Filled 2021-06-19: qty 28, 28d supply, fill #0

## 2021-06-19 MED ORDER — LOSARTAN POTASSIUM 25 MG PO TABS
12.5000 mg | ORAL_TABLET | Freq: Every day | ORAL | 0 refills | Status: DC
  Filled 2021-06-19: qty 45, 90d supply, fill #0

## 2021-06-19 NOTE — Discharge Summary (Signed)
?Discharge Summary  ?  ?Patient ID: Mark Potts ?MRN: [id number]; DOB: [birth year] ? ?Admit date: 06/17/2021 ?Discharge date: 06/19/2021 ? ?PCP:  [facility] ?  ?[facility] ?Cardiologist:  Sherren Mocha, MD    ? ?Discharge Diagnoses  ?  ?Principal Problem: ?  STEMI (ST elevation myocardial infarction) ([place]) ?Active Problems: ?  Tobacco abuse ?  CAD in native artery ?  Mixed hyperlipidemia ?  Hyperglycemia ?  Acute ST elevation myocardial infarction (STEMI) of inferior wall ([facility]) ? ?Diagnostic Studies/Procedures  ?  ?Cath: 06/17/21 ? ?  Prox RCA lesion is 80% stenosed. ?  Dist RCA lesion is 99% stenosed. ?  Prox Cx to Dist Cx lesion is 25% stenosed. ?  Prox LAD lesion is 90% stenosed. ?  A drug-eluting stent was successfully placed using a STENT ONYX FRONTIER 3.5X18. ?  A drug-eluting stent was successfully placed using a STENT ONYX FRONTIER 2.5X30. ?  Post intervention, there is a 0% residual stenosis. ?  Post intervention, there is a 0% residual stenosis. ?  ?1.  Acute inferior STEMI secondary to subtotal occlusion of the distal RCA, 2 RCA lesions treated successfully with PCI using a 2.5 x 30 mm Onyx frontier DES in the distal lesion and a 3.5 x 18 mm Onyx frontier DES in the proximal lesion ?2.  Continued patency of the stented segment in the left circumflex with mild diffuse in-stent restenosis but no obstructive disease ?3.  Severe mid LAD stenosis at the origin of the first diagonal ?4.  Mild LV systolic dysfunction with basal mid inferior hypokinesis, LVEF estimated at 50 to 55% consistent with his acute inferior STEMI ?  ?Recommendations:  ?Aspirin and ticagrelor x12 months without interruption ?Tobacco cessation counseling ?Post MI medical therapy, check 2D echo ?Staged PCI of the LAD to treat severe 90% stenosis and a focal area at the origin of a small first diagonal branch ?  ?Intervention ?ECHO: 06/18/2021 ? 1. Left ventricular ejection fraction, by  estimation, is 50 to 55%. The  ?left ventricle has low normal function. Wall motion difficult to assess  ?due to incomplete visualization of LV endocardium. Based on limited views,  ?the basal-to-mid inferior LV wall  ?is hypokinetic. The rest of the LV segments demonstrate low normal  ?contractility. There is moderate hypertrophy of the basal septal segment.  ?The rest of the LV segments demonstrate mild left ventricular hypertrophy.  ?Left ventricular diastolic parameters  ?are consistent with Grade I diastolic dysfunction (impaired relaxation).  ? 2. Right ventricular systolic function is normal. The right ventricular  ?size is not well visualized. Tricuspid regurgitation signal is inadequate  ?for assessing PA pressure.  ? 3. The mitral valve is grossly normal. Trivial mitral valve  ?regurgitation. No evidence of mitral stenosis.  ? 4. The aortic valve was not well visualized. Aortic valve regurgitation  ?is not visualized. Aortic valve sclerosis/calcification is present,  ?without any evidence of aortic stenosis.  ? 5. The inferior vena cava is normal in size with greater than 50%  ?respiratory variability, suggesting right atrial pressure of 3 mmHg.  ?  ?PCI: 06/18/2021 ?Successful staged PCI of severe stenosis of the proximal/mid LAD at the level of a small first diagonal branch, 90% stenosis reduced to 0% with a 2.5 x 16 mm Synergy DES postdilated to high-pressure with a 2.75 mm noncompliant balloon ?  ?Recommend: Transfer telemetry, anticipate hospital discharge tomorrow, dual antiplatelet therapy with aspirin and ticagrelor x12 months without interruption (ACS class I guideline recommendation) ?  ?  ? ?  _____________ ?  ?History of Present Illness   ?  ?Mark Potts is a 71 y.o. male with CAD and tobacco use who was seen 06/17/2021 for the evaluation of chest pain/inferior STEMI at request of Dr. Billy Fischer in the ER. ?  ?71 y/o smoker with HTN/HL and CAD. Had NSTEMI 06/19/13; cath revealed 70 Lcx, 80 OM, 75  small posterolateral; EF 60; treated with stenting of the circumflex  and OM  (2.5 x 34mm and 2.5 x 24mm), EF 60%. ?  ?Widely patent  left main coronary artery. ? Widely patent  left anterior descending artery and its branches. ? 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 ? Widely patent right coronary artery.  Very small vessel disease in the posterior lateral artery. ?Normal left ventricular systolic function.  LVEDP  24 mmHg.  Ejection fraction  60%  ?  ?Last saw Dr. Stanford Breed in 2017.  ?  ?Reported pain in left shoulder and elbow since Thursday prior to admission. Spread to his chest the day prior to presenting to the ED. Was been on/off overnight. Called 911. ECG showed with inferior ST elevation. Treated with ASA and heparin. Taken to the cath lab. ?  ?Smoking 1 ppd. No recent bleeding. Compliant with ASA.  [place] 4,299  A1c 6.1 ? ?Hospital Course  ?   ?Inferior STEMI: Patient presented with several days of stuttering chest pain.  He was found to have 80% mid and 99% distal RCA stenosis before the PDA takeoff and underwent successful DES stenting x2.  Underwent staged PCI to 90% proximal LAD stenosis in the region of the small first diagonal takeoff.  Placed on DAPT for minimum of 1 year preferably longer.  Tolerated the addition of metoprolol 25 mg twice a day, along with losartan 12.5 mg. EF 50 - 55% with mid-basal inferior hypokinesis; LVH; Grade I DD. Seen by CR and ambulated without recurrent chest pain.  ? ?Tobacco use: Patient started smoking at age 15 and has been smoking for 62 years.   ?-- nicotine patches at discharge  ? ?Hyperlipidemia: Mixed hyperlipidemia with elevated triglycerides and VLDL.  LDL 134 and total cholesterol 222.   ?-- Now on atorvastatin 80 mg.  Consider initiation of Vascepa if triglycerides remain elevated. ?-- FLP/LFTs in 8 weeks  ? ?Type 2 diabetes mellitus:  Hemoglobin A1c 6.1 ?-- diet exercise stressed ?-- consider initiation of therapy if remains  elevated  ?  ?Patient was seen by Dr. Claiborne Billings and deemed stable for discharge home. Follow up arranged in the office. Medications sent to the [facility] pharmacy. Educated by PharmD prior to discharge.  ? ?Did the patient have an acute coronary syndrome (MI, NSTEMI, STEMI, etc) this admission?:  Yes                              ? ?AHA/ACC Clinical Performance & Quality Measures: ?Aspirin prescribed? - Yes ?ADP Receptor Inhibitor (Plavix/Clopidogrel, Brilinta/Ticagrelor or Effient/Prasugrel) prescribed (includes medically managed patients)? - Yes ?Beta Blocker prescribed? - Yes ?High Intensity Statin (Lipitor 40-80mg  or Crestor 20-40mg ) prescribed? - Yes ?EF assessed during THIS hospitalization? - Yes ?For EF <40%, was ACEI/ARB prescribed? - Not Applicable (EF >/= [id number]) ?For EF <40%, Aldosterone Antagonist (Spironolactone or Eplerenone) prescribed? - Not Applicable (EF >/= [id number]) ?Cardiac Rehab Phase II ordered (including medically managed patients)? - Yes  ? ?   ? ?The patient will be scheduled for a TOC follow up appointment  in 10-14 days.  A message has been sent to the [facility] and Scheduling Pool at the office where the patient should be seen for follow up.  ?_____________ ? ?Discharge Vitals ?Blood pressure 115/86, pulse 67, temperature 98.1 ?F (36.7 ?C), temperature source Oral, resp. rate 18, height 5\' 6"  (1.676 m), weight 86.6 kg, SpO2 96 %.  ?Filed Weights  ? 06/17/21 1313 06/18/21 0800  ?Weight: 86.6 kg 86.6 kg  ? ? ?Labs & Radiologic Studies  ?  ?CBC ?Recent Labs  ?  06/18/21 ?0031 06/19/21 ?0032  ?WBC 10.3 10.8*  ?HGB 14.5 13.7  ?HCT 42.7 40.3  ?MCV 97.5 96.6  ?PLT 206 214  ? ?Basic Metabolic Panel ?Recent Labs  ?  06/18/21 ?0031 06/19/21 ?0032  ?NA 137 138  ?K 3.6 4.0  ?CL 107 110  ?CO2 23 20*  ?GLUCOSE 140* 103*  ?BUN 9 15  ?CREATININE 1.02 0.96  ?CALCIUM 8.8* 8.8*  ? ?Liver Function Tests ?Recent Labs  ?  06/19/21 ?0032  ?AST 39  ?ALT 33  ?ALKPHOS 56  ?BILITOT 1.0  ?PROT 6.5  ?ALBUMIN 3.2*  ? ?No results for  input(s): LIPASE, AMYLASE in the last 72 hours. ?High Sensitivity Troponin:   ?Recent Labs  ?Lab 06/17/21 ?1319 06/17/21 ?1623  ?TROPONINIHS 4,299* 11,007*  ?  ?BNP ?Invalid input(s): POCBNP ?D-Dimer ?No results fo

## 2021-06-19 NOTE — Plan of Care (Signed)
Discharge instructions given to patient. Explained and teach back was used. Patient taken to discharge lounge.  ?

## 2021-06-19 NOTE — Progress Notes (Signed)
? ?Progress Note ? ?Patient Name: Mark Potts ?Date of Encounter: 06/19/2021 ? ?Primary Cardiologist: Dr. Stanford Breed ? ?Subjective  ? ?No recurrent chest pain ? ?Inpatient Medications  ?  ?Scheduled Meds: ? aspirin EC  81 mg Oral Daily  ? atorvastatin  80 mg Oral QHS  ? Chlorhexidine Gluconate Cloth  6 each Topical Daily  ? enoxaparin (LOVENOX) injection  40 mg Subcutaneous Q24H  ? insulin aspart  0-9 Units Subcutaneous TID WC  ? metoprolol tartrate  25 mg Oral BID  ? sodium chloride flush  3 mL Intravenous Q12H  ? ticagrelor  90 mg Oral BID  ? ?Continuous Infusions: ? sodium chloride Stopped (06/17/21 2200)  ? sodium chloride    ? sodium chloride    ? ?PRN Meds: ?sodium chloride, sodium chloride, acetaminophen, nitroGLYCERIN, ondansetron (ZOFRAN) IV, sodium chloride, sodium chloride flush, sodium chloride flush  ? ?Vital Signs  ?  ?Vitals:  ? 06/19/21 0650 06/19/21 0700 06/19/21 0800 06/19/21 0835  ?BP:    122/82  ?Pulse:  73 90 84  ?Resp:  (!) 23 17 19   ?Temp: 98 ?F (36.7 ?C)     ?TempSrc: Oral     ?SpO2:  95% 96% 93%  ?Weight:      ?Height:      ? ? ?Intake/Output Summary (Last 24 hours) at 06/19/2021 0927 ?Last data filed at 06/19/2021 0400 ?Gross per 24 hour  ?Intake 1102.49 ml  ?Output 475 ml  ?Net 627.49 ml  ? ? ?I/O since admission: -87 ? ?Filed Weights  ? 06/17/21 1313 06/18/21 0800  ?Weight: 86.6 kg 86.6 kg  ? ? ?Telemetry  ?  ?Sinus rhythm in 80s, no ectopy Personally Reviewed ? ?ECG  ?  ?ECG (independently read by me): NSR at 80, mild  T wave inversion ? ?06/18/2021 ECG (independently read by me): Sinus rhythm with first-degree AV block, PR interval 2 1 2  ms.  Small inferior Q-wave 3 aVF with mild T wave inversion ? ?06/17/2021 ECG (independently read by me): Sinus rhythm at 93 with first-degree AV block, PR interval 248 ms.  0.5 to 1 mm J-point elevation inferiorly with T wave inversion ? ?Physical Exam  ? ? ?BP 122/82   Pulse 84   Temp 98 ?F (36.7 ?C) (Oral)   Resp 19   Ht 5\' 6"  (1.676 m)   Wt 86.6  kg   SpO2 93%   BMI 30.81 kg/m?  ?General: Alert, oriented, no distress.  ?Skin: normal turgor, no rashes, warm and dry ?HEENT: Normocephalic, atraumatic. Pupils equal round and reactive to light; sclera anicteric; extraocular muscles intact;  ?Nose without nasal septal hypertrophy ?Mouth/Parynx benign; Mallinpatti scale 3 ?Neck: No JVD, no carotid bruits; normal carotid upstroke ?Lungs: clear to ausculatation and percussion; no wheezing or rales ?Chest wall: without tenderness to palpitation ?Heart: PMI not displaced, RRR, s1 s2 normal, 1/6 systolic murmur, no diastolic murmur, no rubs, gallops, thrills, or heaves ?Abdomen: soft, nontender; no hepatosplenomehaly, BS+; abdominal aorta nontender and not dilated by palpation. ?Back: no CVA tenderness ?Pulses 2+ right radial cath site stable ?Musculoskeletal: full range of motion, normal strength, no joint deformities ?Extremities: no clubbing cyanosis or edema, Homan's sign negative  ?Neurologic: grossly nonfocal; Cranial nerves grossly wnl ?Psychologic: Normal mood and affect ? ? ?Labs  ?  ?Chemistry ?Recent Labs  ?Lab 06/17/21 ?1319 06/18/21 ?0031 06/19/21 ?0032  ?NA 134* 137 138  ?K 3.7 3.6 4.0  ?CL 102 107 110  ?CO2 22 23 20*  ?GLUCOSE 186* 140* 103*  ?  BUN 9 9 15   ?CREATININE 1.07 1.02 0.96  ?CALCIUM 9.3 8.8* 8.8*  ?PROT  --   --  6.5  ?ALBUMIN  --   --  3.2*  ?AST  --   --  39  ?ALT  --   --  33  ?ALKPHOS  --   --  56  ?BILITOT  --   --  1.0  ?GFRNONAA >60 >60 >60  ?ANIONGAP 10 7 8   ?  ? ?Hematology ?Recent Labs  ?Lab 06/17/21 ?1319 06/18/21 ?0031 06/19/21 ?0032  ?WBC 9.7 10.3 10.8*  ?RBC 4.86 4.38 4.17*  ?HGB 15.9 14.5 13.7  ?HCT 47.3 42.7 40.3  ?MCV 97.3 97.5 96.6  ?MCH 32.7 33.1 32.9  ?MCHC 33.6 34.0 34.0  ?RDW 13.2 13.3 13.3  ?PLT 230 206 214  ? ?HS troponin: 4299 > 11,007 ? ?Cardiac EnzymesNo results for input(s): TROPONINI in the last 168 hours. No results for input(s): TROPIPOC in the last 168 hours.  ? ?BNPNo results for input(s): BNP, PROBNP in the  last 168 hours.  ? ?DDimer No results for input(s): DDIMER in the last 168 hours.  ? ?Lipid Panel  ?   ?Component Value Date/Time  ? CHOL 222 (H) 06/17/2021 1324  ? TRIG 227 (H) 06/17/2021 1324  ? HDL 43 06/17/2021 1324  ? CHOLHDL 5.2 06/17/2021 1324  ? VLDL 45 (H) 06/17/2021 1324  ? LDLCALC 134 (H) 06/17/2021 1324  ?  ? ?Radiology  ?  ?CARDIAC CATHETERIZATION ? ?Result Date: 06/18/2021 ?Successful staged PCI of severe stenosis of the proximal/mid LAD at the level of a small first diagonal branch, 90% stenosis reduced to 0% with a 2.5 x 16 mm Synergy DES postdilated to high-pressure with a 2.75 mm noncompliant balloon Recommend: Transfer telemetry, anticipate hospital discharge tomorrow, dual antiplatelet therapy with aspirin and ticagrelor x12 months without interruption (ACS class I guideline recommendation)  ? ?CARDIAC CATHETERIZATION ? ?Result Date: 06/17/2021 ?  Prox RCA lesion is 80% stenosed.   Dist RCA lesion is 99% stenosed.   Prox Cx to Dist Cx lesion is 25% stenosed.   Prox LAD lesion is 90% stenosed.   A drug-eluting stent was successfully placed using a STENT ONYX FRONTIER 3.5X18.   A drug-eluting stent was successfully placed using a STENT ONYX FRONTIER 2.5X30.   Post intervention, there is a 0% residual stenosis.   Post intervention, there is a 0% residual stenosis. 1.  Acute inferior STEMI secondary to subtotal occlusion of the distal RCA, 2 RCA lesions treated successfully with PCI using a 2.5 x 30 mm Onyx frontier DES in the distal lesion and a 3.5 x 18 mm Onyx frontier DES in the proximal lesion 2.  Continued patency of the stented segment in the left circumflex with mild diffuse in-stent restenosis but no obstructive disease 3.  Severe mid LAD stenosis at the origin of the first diagonal 4.  Mild LV systolic dysfunction with basal mid inferior hypokinesis, LVEF estimated at 50 to 55% consistent with his acute inferior STEMI Recommendations: Aspirin and ticagrelor x12 months without interruption  Tobacco cessation counseling Post MI medical therapy, check 2D echo Staged PCI of the LAD to treat severe 90% stenosis and a focal area at the origin of a small first diagonal branch  ? ?DG Chest Port 1 View ? ?Result Date: 06/17/2021 ?CLINICAL DATA:  Chest pain. EXAM: PORTABLE CHEST 1 VIEW COMPARISON:  06/19/2013 FINDINGS: Cardiac silhouette is normal in size. No mediastinal or hilar masses. Clear lungs.  No convincing  pleural effusion.  No pneumothorax. Skeletal structures are grossly intact. IMPRESSION: No active disease. Electronically Signed   By: Lajean Manes M.D.   On: 06/17/2021 13:52  ? ?ECHOCARDIOGRAM COMPLETE ? ?Result Date: 06/18/2021 ?   ECHOCARDIOGRAM REPORT   Patient Name:   Mark Potts Date of Exam: 06/18/2021 Medical Rec #:  [id number]       Height:       66.0 in Accession #:    [id number]      Weight:       190.9 lb Date of Birth:  [birth year]       BSA:          1.961 m? Patient Age:    71 years        BP:           108/84 mmHg Patient Gender: M               HR:           67 bpm. Exam Location:  Inpatient Procedure: 2D Echo Indications:    Acute myocardial infarction  History:        Patient has no prior history of Echocardiogram examinations. CAD                 and Acute MI; Risk Factors:Dyslipidemia.  Sonographer:    Arlyss Gandy Referring Phys: [id number] DANIEL R BENSIMHON  Sonographer Comments: Image acquisition challenging due to patient body habitus and supine post cath. IMPRESSIONS  1. Left ventricular ejection fraction, by estimation, is 50 to 55%. The left ventricle has low normal function. Wall motion difficult to assess due to incomplete visualization of LV endocardium. Based on limited views, the basal-to-mid inferior LV wall is hypokinetic. The rest of the LV segments demonstrate low normal contractility. There is moderate hypertrophy of the basal septal segment. The rest of the LV segments demonstrate mild left ventricular hypertrophy. Left ventricular diastolic parameters are consistent  with Grade I diastolic dysfunction (impaired relaxation).  2. Right ventricular systolic function is normal. The right ventricular size is not well visualized. Tricuspid regurgitation signal is inadequate for asse

## 2021-06-19 NOTE — Progress Notes (Signed)
CARDIAC REHAB PHASE I  ? ?PRE:  Rate/Rhythm: 90 sR ? ?  BP: sitting 122/82 ? ?  SaO2: 96 RA ? ?MODE:  Ambulation: 370 ft  ? ?POST:  Rate/Rhythm: 108 ST ? ?  BP: sitting 157/91  ? ?  SaO2: 96 RA ? ?Tolerated well, no c/o. BP and HR elevated. Discussed with pt MI, stents, Brilinta importance, restrictions, smoking cessation, diet, exercise, NTG, and CRPII. Pt receptive, thinking/planning on quitting smoking. Needs more exercise as well. Will refer to [place] CRPII.  ?[id number]  ? ?Ethelda Chick CES, ACSM ?06/19/2021 ?9:18 AM ? ? ? ? ?

## 2021-06-19 NOTE — Telephone Encounter (Signed)
**Note De-Identified Mark Potts Obfuscation** The pt is being discharged from [facility] today. ?We will call tomorrow. ?

## 2021-06-19 NOTE — Research (Signed)
ID- [id number],  SOS-AMI ? ?Authorized Site Personnel  [x]   , RN ?    []   Eulogio Ditch, RN  ?[]   , RN  ?[]   Evern Bio, RCIS  ?[]   , RN  ? ?SUBJECT NAME: Mark Welborn_ ?MRN: ? ? ?Date of study introduction: _11-APR-2023_   ([id number]) ?   Time of introduction: __0930_______  (24 hour clock) ? ?During the subject's hospital visit , the authorized site personnel discussed with the subject the possibility to participate in the SOS-AMI study, and provided  ?the subject with the approved Subject Information Leaflet (SIL)-Informed Consent ?form (ICF) in a language that he/she can understand.  ?The subject took the document home to reflect on his/her participation ?in this study before signing it.  ? ?[x]   The Subject will return to the Cardiovascular Research office, at a later day, to  ?          complete the consenting process.  ?OR ? ?[]   The Subject will be given ample time to reflect on this study but will be completing  ?          consent process prior to his/ her discharge from this admission. ? ?**Form based on [employer] [id number] slide [id number]_ICF [id number]  ?

## 2021-06-20 MED ORDER — NICOTINE 14 MG/24HR TD PT24: 14.0000 mg | MEDICATED_PATCH | TRANSDERMAL | 0 refills | Status: DC

## 2021-06-20 NOTE — Telephone Encounter (Signed)
Patient contacted regarding discharge from [facility] on 06/19/21.  ? ?Patient understands to follow up with B. Bhagat, PA-C on 07/02/21 at 8:50 am ?Patient understands discharge instructions? Yes. ?Patient understands medications and regiment? Yes. ?Patient understands to bring all medications to this visit? Yes. ? ?Reviewed cath site and instructions.   ? ?Only concern is that he was did not get Nicotene patches that were ordered.  Med list indicated ordered 4/11 and "never dispensed".  [facility] ? ? ?

## 2021-06-22 ENCOUNTER — Telehealth (HOSPITAL_COMMUNITY): Payer: Self-pay

## 2021-06-22 NOTE — Telephone Encounter (Signed)
Pt insurance is active and benefits verified through [facility] Medicare Co-pay 0, DED 0/0 met, out of pocket $8,300/0 met, co-insurance 20%. no pre-authorization required. Passport, 06/22/2021@10 Raoul Pitch, REF# [id number] ?  ?Will contact patient to see if he is interested in the Cardiac Rehab Program. If interested, patient will need to complete follow up appt. Once completed, patient will be contacted for scheduling upon review by the RN Navigator. ?

## 2021-06-26 ENCOUNTER — Other Ambulatory Visit (HOSPITAL_COMMUNITY): Payer: Self-pay

## 2021-06-26 ENCOUNTER — Telehealth (HOSPITAL_COMMUNITY): Payer: Self-pay

## 2021-06-26 NOTE — Telephone Encounter (Signed)
Pharmacy Transitions of Care Follow-up Telephone Call ? ?Date of discharge: 06/19/2021  ?Discharge Diagnosis: STEMI ? ?How have you been since you were released from the hospital? Patient reports feeling overall fine since discharge. ? ?START taking: ?Aspirin Low Dose (aspirin)  ?This replaces a similar medication. See the full medication list for instructions. ?Brilinta (ticagrelor)  ?losartan (COZAAR)  ? CHANGE how you take: ?atorvastatin (LIPITOR)  ?metoprolol tartrate (LOPRESSOR)  ?nitroGLYCERIN (Nitrostat)  ?  STOP taking: ?aspirin 81 MG chewable tablet  ?Replaced by a similar medication ? ?Medication changes verified by the patient? Yes ?  ? ?Medication Accessibility: ? ?[facility] [address]  ? ?Was the patient provided with refills on discharged medications? Yes  ? ?Have all prescriptions been transferred from [facility] to home pharmacy? Yes  ? ?Is the patient able to afford medications? Yes ?  ? ?Medication Review: ? ?TICAGRELOR (BRILINTA) ?Ticagrelor 90 mg BID initiated on 06/19/2021.  ?- Educated patient on expected duration of therapy of aspirin x12 months with ticagrelor.  ?- Discussed importance of taking medication around the same time every day, ?- Advised patient of medications to avoid (NSAIDs, aspirin maintenance doses>100 mg daily) ?- Educated that Tylenol (acetaminophen) will be the preferred analgesic to prevent risk of bleeding  ?- Emphasized importance of monitoring for signs and symptoms of bleeding (abnormal bruising, prolonged bleeding, nose bleeds, bleeding from gums, discolored urine, black tarry stools)  ?- Educated patient to notify doctor if shortness of breath or abnormal heartbeat occur ?- Advised patient to alert all providers of antiplatelet therapy prior to starting a new medication or having a procedure  ? ?Follow-up Appointments: ? ?[facility] f[facility]u appt confirmed? CVD Scheduled to see Manson Passey on 07/02/2021 @ 8:00am.  ? ?If their condition worsens, is the pt  aware to call PCP or go to the Emergency Dept.? Yes ? ?Final Patient Assessment: ?-Pt is doing well.  ?-Pt verbalized understanding of Brillinta.  ?-Pt has post discharge appointment [facility] [address] ? ? ?

## 2021-07-01 NOTE — Progress Notes (Signed)
?Cardiology Office Note:   ? ?Date:  07/02/2021  ? ?ID:  Mark Potts, DOB [birth year], MRN [id number] ? ?PCP:  [facility]  ?[facility] Cardiologist:  Sherren Mocha, MD  ?[facility] Electrophysiologist:  None  ? ?Chief Complaint: hospital follow up ? ?History of Present Illness:   ? ?Mark Potts is a 71 y.o. male with a hx of CAD s/p stenting to circumflex and OM in 2015, hyperlipidemia and tobacco abuse presents for hospital follow-up.  ? ?Admitted 06/2021 for inferior STEMI. He was found to have 80% mid and 99% distal RCA stenosis before the PDA takeoff and underwent successful DES stenting x2.  Underwent staged PCI to 90% proximal LAD stenosis in the region of the small first diagonal takeoff.  Placed on DAPT for minimum of 1 year preferably longer.  Tolerated the addition of metoprolol 25 mg twice a day, along with losartan 12.5 mg. EF 50 - 55% with mid-basal inferior hypokinesis; LVH; Grade I DD.  Recommended smoking sensation. ? ?Patient is here for follow-up.  Recovering well.  No exertional chest pain.  He has cut back on smoking to half a pack a day.  Previously smoking 1 pack/day.  He denies dizziness, orthopnea, PND, syncope, lower extremity edema or melena.  Some shortness of breath with activity which has been improving. ? ?Past Medical History:  ?Diagnosis Date  ? CAD (coronary artery disease)   ? a. PCI of LCX and OM1 in 2015. b. s/p DES to RCA x 2 & DES to pLAD 06/2021  ? Hyperlipemia   ? NSTEMI (non-ST elevated myocardial infarction) ([place])   ? ? ?Past Surgical History:  ?Procedure Laterality Date  ? CORONARY ANGIOPLASTY WITH STENT PLACEMENT  06/21/13  ? PCI to mid LCX and OM1 with resolute DES  ? CORONARY STENT INTERVENTION N/A 06/18/2021  ? Procedure: CORONARY STENT INTERVENTION;  Surgeon: Sherren Mocha, MD;  Location: [place] [facility];  Service: Cardiovascular;  Laterality: N/A;  ? CORONARY/GRAFT ACUTE MI REVASCULARIZATION N/A 06/17/2021  ? Procedure:  Coronary/Graft Acute MI Revascularization;  Surgeon: Sherren Mocha, MD;  Location: [place] [facility];  Service: Cardiovascular;  Laterality: N/A;  ? LEFT HEART CATH AND CORONARY ANGIOGRAPHY N/A 06/17/2021  ? Procedure: LEFT HEART CATH AND CORONARY ANGIOGRAPHY;  Surgeon: Sherren Mocha, MD;  Location: [place] [facility];  Service: Cardiovascular;  Laterality: N/A;  ? LEFT HEART CATHETERIZATION WITH CORONARY ANGIOGRAM N/A 06/21/2013  ? Procedure: LEFT HEART CATHETERIZATION WITH CORONARY ANGIOGRAM;  Surgeon: Jettie Booze, MD;  Location: [facility] CATH LAB;  Service: Cardiovascular;  Laterality: N/A;  ? ? ?Current Medications: ?Current Meds  ?Medication Sig  ? acetaminophen (TYLENOL) 325 MG tablet Take 650 mg by mouth every 6 (six) hours as needed for mild pain or headache.  ? aspirin 81 MG EC tablet Take 1 tablet (81 mg total) by mouth daily. Swallow whole.  ? atorvastatin (LIPITOR) 80 MG tablet Take 1 tablet (80 mg total) by mouth at bedtime.  ? losartan (COZAAR) 25 MG tablet Take 1/2 tablet (12.5 mg total) by mouth daily.  ? Menthol, Topical Analgesic, (BENGAY ULTRA STRENGTH EX) Apply topically as needed.  ? nitroGLYCERIN (NITROSTAT) 0.4 MG SL tablet Place 1 tablet (0.4 mg total) under the tongue every 5 (five) minutes as needed for chest pain.  ? SALINE NASAL SPRAY NA Place into the nose as needed. [place] nasal spray taking as needed  ? ticagrelor (BRILINTA) 90 MG TABS tablet Take 1 tablet (90 mg total) by  mouth 2 (two) times daily.  ? [DISCONTINUED] metoprolol tartrate (LOPRESSOR) 25 MG tablet Take 1 tablet (25 mg total) by mouth 2 (two) times daily.  ?  ? ?Allergies:   Patient has no known allergies.  ? ?Social History  ? ?Socioeconomic History  ? Marital status: Divorced  ?  Spouse name: Not on file  ? Number of children: 2  ? Years of education: Not on file  ? Highest education level: Not on file  ?Occupational History  ?  Employer: [employer]  ?Tobacco Use  ? Smoking status: Every Day  ?   Packs/day: 1.00  ?  Types: Cigarettes  ? Smokeless tobacco: Never  ?Substance and Sexual Activity  ? Alcohol use: No  ?  Alcohol/week: 0.0 standard drinks  ? Drug use: No  ? Sexual activity: Not on file  ?Other Topics Concern  ? Not on file  ?Social History Narrative  ? Not on file  ? ?Social Determinants of Health  ? ?Financial Resource Strain: Not on file  ?Food Insecurity: Not on file  ?Transportation Needs: Not on file  ?Physical Activity: Not on file  ?Stress: Not on file  ?Social Connections: Not on file  ?  ? ?Family History: ?The patient's family history includes Cancer in his brother; Heart disease in an other family member.   ? ?ROS:   ?Please see the history of present illness.    ?All other systems reviewed and are negative.  ? ?EKGs/Labs/Other Studies Reviewed:   ? ?The following studies were reviewed today: ?Cath: 06/17/21 ?  ?  Prox RCA lesion is 80% stenosed. ?  Dist RCA lesion is 99% stenosed. ?  Prox Cx to Dist Cx lesion is 25% stenosed. ?  Prox LAD lesion is 90% stenosed. ?  A drug-eluting stent was successfully placed using a STENT ONYX FRONTIER 3.5X18. ?  A drug-eluting stent was successfully placed using a STENT ONYX FRONTIER 2.5X30. ?  Post intervention, there is a 0% residual stenosis. ?  Post intervention, there is a 0% residual stenosis. ?  ?1.  Acute inferior STEMI secondary to subtotal occlusion of the distal RCA, 2 RCA lesions treated successfully with PCI using a 2.5 x 30 mm Onyx frontier DES in the distal lesion and a 3.5 x 18 mm Onyx frontier DES in the proximal lesion ?2.  Continued patency of the stented segment in the left circumflex with mild diffuse in-stent restenosis but no obstructive disease ?3.  Severe mid LAD stenosis at the origin of the first diagonal ?4.  Mild LV systolic dysfunction with basal mid inferior hypokinesis, LVEF estimated at 50 to 55% consistent with his acute inferior STEMI ?  ?Recommendations:  ?Aspirin and ticagrelor x12 months without interruption ?Tobacco  cessation counseling ?Post MI medical therapy, check 2D echo ?Staged PCI of the LAD to treat severe 90% stenosis and a focal area at the origin of a small first diagonal branch ?  ?Intervention ?ECHO: 06/18/2021 ? 1. Left ventricular ejection fraction, by estimation, is 50 to 55%. The  ?left ventricle has low normal function. Wall motion difficult to assess  ?due to incomplete visualization of LV endocardium. Based on limited views,  ?the basal-to-mid inferior LV wall  ?is hypokinetic. The rest of the LV segments demonstrate low normal  ?contractility. There is moderate hypertrophy of the basal septal segment.  ?The rest of the LV segments demonstrate mild left ventricular hypertrophy.  ?Left ventricular diastolic parameters  ?are consistent with Grade I diastolic dysfunction (impaired relaxation).  ?  2. Right ventricular systolic function is normal. The right ventricular  ?size is not well visualized. Tricuspid regurgitation signal is inadequate  ?for assessing PA pressure.  ? 3. The mitral valve is grossly normal. Trivial mitral valve  ?regurgitation. No evidence of mitral stenosis.  ? 4. The aortic valve was not well visualized. Aortic valve regurgitation  ?is not visualized. Aortic valve sclerosis/calcification is present,  ?without any evidence of aortic stenosis.  ? 5. The inferior vena cava is normal in size with greater than 50%  ?respiratory variability, suggesting right atrial pressure of 3 mmHg.  ?  ?PCI: 06/18/2021 ?Successful staged PCI of severe stenosis of the proximal/mid LAD at the level of a small first diagonal branch, 90% stenosis reduced to 0% with a 2.5 x 16 mm Synergy DES postdilated to high-pressure with a 2.75 mm noncompliant balloon ?  ?Recommend: Transfer telemetry, anticipate hospital discharge tomorrow, dual antiplatelet therapy with aspirin and ticagrelor x12 months without interruption (ACS class I guideline recommendation) ?  ?  ?_____________ ?  ? ?EKG:  EKG is not ordered today.    ? ?Recent Labs: ?06/19/2021: ALT 33; BUN 15; Creatinine, Ser 0.96; Hemoglobin 13.7; Platelets 214; Potassium 4.0; Sodium 138  ?Recent Lipid Panel ?   ?Component Value Date/Time  ? CHOL 222 (H) 06/17/2021 1324

## 2021-07-02 ENCOUNTER — Ambulatory Visit (INDEPENDENT_AMBULATORY_CARE_PROVIDER_SITE_OTHER): Payer: Medicare Other | Admitting: Physician Assistant

## 2021-07-02 ENCOUNTER — Encounter: Payer: Self-pay | Admitting: Physician Assistant

## 2021-07-02 VITALS — BP 124/84 | HR 74 | Ht 66.0 in | Wt 196.6 lb

## 2021-07-02 DIAGNOSIS — E785 Hyperlipidemia, unspecified: Secondary | ICD-10-CM | POA: Diagnosis not present

## 2021-07-02 DIAGNOSIS — I25110 Atherosclerotic heart disease of native coronary artery with unstable angina pectoris: Secondary | ICD-10-CM | POA: Diagnosis not present

## 2021-07-02 DIAGNOSIS — I2119 ST elevation (STEMI) myocardial infarction involving other coronary artery of inferior wall: Secondary | ICD-10-CM

## 2021-07-02 DIAGNOSIS — I10 Essential (primary) hypertension: Secondary | ICD-10-CM

## 2021-07-02 MED ORDER — NICOTINE 14 MG/24HR TD PT24: 14.0000 mg | MEDICATED_PATCH | TRANSDERMAL | 0 refills | Status: DC

## 2021-07-02 MED ORDER — METOPROLOL TARTRATE 25 MG PO TABS: 25.0000 mg | ORAL_TABLET | Freq: Two times a day (BID) | ORAL | 3 refills | Status: AC

## 2021-07-02 NOTE — Patient Instructions (Addendum)
Medication Instructions:  ?Your physician recommends that you continue on your current medications as directed. Please refer to the Current Medication list given to you today. ? ?*If you need a refill on your cardiac medications before your next appointment, please call your pharmacy* ? ? ?Lab Work: ?None ?If you have labs (blood work) drawn today and your tests are completely normal, you will receive your results only by: ?MyChart Message (if you have MyChart) OR ?A paper copy in the mail ?If you have any lab test that is abnormal or we need to change your treatment, we will call you to review the results. ? ? ?Follow-Up: ?At [facility], you and your health needs are our priority.  As part of our continuing mission to provide you with exceptional heart care, we have created designated Provider Care Teams.  These Care Teams include your primary Cardiologist (physician) and Advanced Practice Providers (APPs -  Physician Assistants and Nurse Practitioners) who all work together to provide you with the care you need, when you need it. ? ?We recommend signing up for the patient portal called "MyChart".  Sign up information is provided on this After Visit Summary.  MyChart is used to connect with patients for Virtual Visits (Telemedicine).  Patients are able to view lab/test results, encounter notes, upcoming appointments, etc.  Non-urgent messages can be sent to your provider as well.   ?To learn more about what you can do with MyChart, go to [email or web address].   ? ?Your next appointment:   ?3 month(s) ? ?The format for your next appointment:   ?In Person ? ?Provider:   ?Tonny Bollman, MD or APP ? ? ?Important Information About Sugar ? ? ? ? ?  ?

## 2021-08-10 ENCOUNTER — Telehealth (HOSPITAL_COMMUNITY): Payer: Self-pay

## 2021-08-10 NOTE — Telephone Encounter (Signed)
Pt is not interested in the cardiac rehab program. Closed referral 

## 2021-10-07 NOTE — Progress Notes (Signed)
Cardiology Office Note:    Date:  10/08/2021   ID:  Mark Potts, DOB [birth year], MRN [id number]  PCP:  [facility]   [facility] Providers Cardiologist:  Tonny Bollman, MD     Referring MD: Roe Coombs*    CC: chronic SOB with bending over  History of Present Illness:    Mark Potts is a 71 y.o. male with a hx of the following:   CAD NSTEMI, s/p DES to Lcx and OM in 2015 Inferior STEMI, s/p DES to RCA X 2 and DES to pLAD  in 06/2021, mild ICM EF 50-55% Obesity Mixed hyperlipidemia Hyperglycemia Tobacco abuse  He was last seen by cardiology services on July 02, 2021 and was seen in the office by Manson Passey, PA-C for inferior STEMI follow-up.  He was found to be recovering well and had cut back on smoking to half a pack a day from his previous smoking 1 pack/day.  He denied any chest pain, dizziness, PND, orthopnea, syncope, edema, or melena, had some shortness of breath with exertion that seem to be improving.  Tolerating Brilinta and aspirin well.  Today he presents for 80-month follow-up.  States he is doing overall well from a cardiac perspective and denies any acute changes to his health since he was last seen in April.  Only chief complaint today is shortness of breath with bending over, says this is his baseline and chronic for him.  Unfortunately, he has increased his tobacco use and is smoking 3/4 pack/day.  Does not have any desire to quit at this time and does not have any quit date planned.  Denies chest pain, worsening shortness of breath, palpitations, syncope, presyncope, dizziness, PND, orthopnea, swelling, significant weight changes, bleeding, or claudication.  States he does not exercise and eats out frequently at restaurants throughout the week.  Has not started or been contacted by cardiac rehab but has been given information about this.  Blood pressures at home are normally 120s to 130s systolic and  denies any low blood pressure readings.  Tolerating his medication okay and denies any other question or concern during visit.    Past Medical History:  Diagnosis Date   CAD (coronary artery disease)    a. PCI of LCX and OM1 in 2015. b. s/p DES to RCA x 2 & DES to pLAD 06/2021   Hyperlipemia    NSTEMI (non-ST elevated myocardial infarction) [facility])     Past Surgical History:  Procedure Laterality Date   CORONARY ANGIOPLASTY WITH STENT PLACEMENT  06/21/13   PCI to mid LCX and OM1 with resolute DES   CORONARY STENT INTERVENTION N/A 06/18/2021   Procedure: CORONARY STENT INTERVENTION;  Surgeon: Tonny Bollman, MD;  Location: [facility] INVASIVE CV LAB;  Service: Cardiovascular;  Laterality: N/A;   CORONARY/GRAFT ACUTE MI REVASCULARIZATION N/A 06/17/2021   Procedure: Coronary/Graft Acute MI Revascularization;  Surgeon: Tonny Bollman, MD;  Location: [facility] INVASIVE CV LAB;  Service: Cardiovascular;  Laterality: N/A;   LEFT HEART CATH AND CORONARY ANGIOGRAPHY N/A 06/17/2021   Procedure: LEFT HEART CATH AND CORONARY ANGIOGRAPHY;  Surgeon: Tonny Bollman, MD;  Location: [facility] INVASIVE CV LAB;  Service: Cardiovascular;  Laterality: N/A;   LEFT HEART CATHETERIZATION WITH CORONARY ANGIOGRAM N/A 06/21/2013   Procedure: LEFT HEART CATHETERIZATION WITH CORONARY ANGIOGRAM;  Surgeon: Corky Crafts, MD;  Location: [facility] CATH LAB;  Service: Cardiovascular;  Laterality: N/A;    Current Medications: Current Meds  Medication Sig   acetaminophen (TYLENOL) 325 MG tablet  Take 650 mg by mouth every 6 (six) hours as needed for mild pain or headache.   aspirin 81 MG EC tablet Take 1 tablet (81 mg total) by mouth daily. Swallow whole.   atorvastatin (LIPITOR) 80 MG tablet Take 1 tablet (80 mg total) by mouth at bedtime.   losartan (COZAAR) 25 MG tablet Take 1/2 tablet (12.5 mg total) by mouth daily.   Menthol, Topical Analgesic, (BENGAY ULTRA STRENGTH EX) Apply topically as needed.   metoprolol tartrate (LOPRESSOR) 25 MG tablet  Take 1 tablet (25 mg total) by mouth 2 (two) times daily.   nitroGLYCERIN (NITROSTAT) 0.4 MG SL tablet Place 1 tablet (0.4 mg total) under the tongue every 5 (five) minutes as needed for chest pain.   SALINE NASAL SPRAY NA Place into the nose as needed. Deep Sea nasal spray taking as needed   ticagrelor (BRILINTA) 90 MG TABS tablet Take 1 tablet (90 mg total) by mouth 2 (two) times daily.   [DISCONTINUED] nicotine (NICODERM CQ - DOSED IN MG/24 HOURS) 14 mg/24hr patch Place 1 patch (14 mg total) onto the skin daily.     Allergies:   Patient has no known allergies.   Social History   Socioeconomic History   Marital status: Divorced    Spouse name: Not on file   Number of children: 2   Years of education: Not on file   Highest education level: Not on file  Occupational History    Employer: [employer]  Tobacco Use   Smoking status: Every Day    Packs/day: 1.00    Types: Cigarettes   Smokeless tobacco: Never  Substance and Sexual Activity   Alcohol use: No    Alcohol/week: 0.0 standard drinks of alcohol   Drug use: No   Sexual activity: Not on file  Other Topics Concern   Not on file  Social History Narrative   Not on file   Social Determinants of Health   Financial Resource Strain: Not on file  Food Insecurity: Not on file  Transportation Needs: Not on file  Physical Activity: Not on file  Stress: Not on file  Social Connections: Not on file     Family History: The patient's family history includes Cancer in his brother; Heart disease in an other family member. Positive family history of type 2 diabetes in his dad.   ROS:   Review of Systems  Constitutional:  Negative for chills, fever, malaise/fatigue and weight loss.  HENT:  Positive for congestion. Negative for ear discharge, ear pain, hearing loss, nosebleeds, sinus pain, sore throat and tinnitus.        Post nasal drip symptoms at night X couple months.   Eyes: Negative.   Respiratory:  Positive  for shortness of breath. Negative for cough, hemoptysis, sputum production, wheezing and stridor.        See HPI.   Gastrointestinal: Negative.   Genitourinary: Negative.   Musculoskeletal:  Positive for joint pain. Negative for back pain, falls, myalgias and neck pain.  Skin:  Negative for itching and rash.  Neurological: Negative.   Endo/Heme/Allergies: Negative.   Psychiatric/Behavioral: Negative.     Please see the history of present illness.    All other systems reviewed and are negative.  EKGs/Labs/Other Studies Reviewed:    The following studies were reviewed today:   EKG:  EKG is ordered today.  The ekg ordered today demonstrates SR with first degree AV block, nonspecific ST segment change, otherwise no acute change.  2D echo on June 18, 2021: LVEF is 50 to 55%.  Left ventricle has low normal function.  Wall motion difficult to assess due to incomplete visualization of LV endocardium.  Based on limited views, the basal to mid inferior LV wall is hypokinetic.  The rest of the LV segments demonstrate low normal contractility.  There is moderate hypertrophy of the basal septal segment.  The rest of the LV segments demonstrate mild left ventricular hypertrophy.  The left ventricular diastolic parameters are consistent with grade 1 diastolic dysfunction (impaired relaxation). 2.  Right ventricular systolic function is normal.  The right ventricular size is not well visualized.  Tricuspid regurgitation signal is not adequate for assessing PA pressure. 3.  The mitral valve is grossly normal.  Trivial mitral valve regurgitation.  No evidence of mitral stenosis. 4.  The aortic valve was not well visualized. Aortic valve regurgitation is not visualized.  Aortic valve sclerosis/calcification is present, without any evidence of aortic stenosis. 5.  The inferior vena cava is normal in size with greater than 50% respiratory variability, suggesting right atrial pressure of 3 mmHg.  Coronary  stent intervention on June 18, 2021: Successful staged PCI of severe stenosis of the proximal/mid LAD at the level of a small first diagonal branch, 90% stenosis reduced to 0% with a 2.5 x 16 mm Synergy DES postdilated to high-pressure with a 2.75 mm noncompliant balloon. Recommend: Transfer to telemetry, anticipate hospital discharge tomorrow, dual antiplatelet therapy with aspirin and Brilinta x12 months without interruption (ACS class I guideline recommendation).   Left heart cath and coronary angiography on June 17, 2021:   Prox RCA lesion is 80% stenosed.   Dist RCA lesion is 99% stenosed.   Prox Cx to Dist Cx lesion is 25% stenosed.   Prox LAD lesion is 90% stenosed.   A drug-eluting stent was successfully placed using a STENT ONYX FRONTIER 3.5X18.   A drug-eluting stent was successfully placed using a STENT ONYX FRONTIER 2.5X30.   Post intervention, there is a 0% residual stenosis.   Post intervention, there is a 0% residual stenosis.   1.  Acute inferior STEMI secondary to subtotal occlusion of the distal RCA, 2 RCA lesions treated successfully with PCI using a 2.5 x 30 mm Onyx frontier DES in the distal lesion and a 3.5 x 18 mm Onyx frontier DES in the proximal lesion 2.  Continued patency of the stented segment in the left circumflex with mild diffuse in-stent restenosis but no obstructive disease 3.  Severe mid LAD stenosis at the origin of the first diagonal 4.  Mild LV systolic dysfunction with basal mid inferior hypokinesis, LVEF estimated at 50 to 55% consistent with his acute inferior STEMI   Recommendations:  Aspirin and ticagrelor x12 months without interruption Tobacco cessation counseling Post MI medical therapy, check 2D echo Staged PCI of the LAD to treat severe 90% stenosis and a focal area at the origin of a small first diagonal branch    Recent Labs: 06/19/2021: ALT 33; BUN 15; Creatinine, Ser 0.96; Hemoglobin 13.7; Platelets 214; Potassium 4.0; Sodium 138   Recent Lipid Panel    Component Value Date/Time   CHOL 222 (H) 06/17/2021 1324   TRIG 227 (H) 06/17/2021 1324   HDL 43 06/17/2021 1324   CHOLHDL 5.2 06/17/2021 1324   VLDL 45 (H) 06/17/2021 1324   LDLCALC 134 (H) 06/17/2021 1324    Physical Exam:    VS:  BP 100/70   Pulse 65   Ht 5'  6" (1.676 m)   Wt 192 lb 3.2 oz (87.2 kg)   SpO2 96%   BMI 31.02 kg/m     Manual BP recheck today on exam: 102/62.   Wt Readings from Last 3 Encounters:  10/08/21 192 lb 3.2 oz (87.2 kg)  07/02/21 196 lb 9.6 oz (89.2 kg)  06/18/21 190 lb 14.7 oz (86.6 kg)     GEN: Well nourished, well developed in no acute distress HEENT: Normal NECK: No JVD; No carotid bruits CARDIAC: RRR, no murmurs, rubs, gallops; 2+ pulses throughout, strong and equal bilaterally RESPIRATORY:  Clear to auscultation without rales, wheezing or rhonchi  ABDOMEN: Soft, non-tender, non-distended, bowel sounds  X 4 MUSCULOSKELETAL:  No edema; No deformity  SKIN: Warm and dry NEUROLOGIC:  Alert and oriented x 3 PSYCHIATRIC:  Normal affect   ASSESSMENT:    1. Coronary artery disease involving native heart without angina pectoris, unspecified vessel or lesion type   2. ST elevation myocardial infarction (STEMI), unspecified artery (HCC)   3. Mixed hyperlipidemia   4. Tobacco abuse   5. Blood pressure check    PLAN:    In order of problems listed above:  CAD, s/p STEMI on 06/2021, Mixed hyperlipidemia with LDL goal < 70 - chronic, stable Twelve-lead EKG stable overall normal sinus rhythm with first-degree block, without any acute changes since last EKG.  Stable with no anginal symptoms since MI in 06/2021. No indication for ischemic evaluation.  Continue DAPT (Brilinta and Aspirin 81 mg daily) for 12 months post drug-eluting stent to RCA x2 and proximal LAD. Continue atorvastatin as last LDL was at goal at outside facility. Continue current medication regimen.  Will reach out to [facility] for cardiac rehab for  patient. Heart healthy diet and regular cardiovascular exercise encouraged.   2. Tobacco abuse - chronic, not improved Recently increase his tobacco abuse from half a pack per day to 3/4 pack/day.  Motivational interviewing performed and patient is not interested in quitting at this time.  Discussed risks of continuing smoking on his overall health.  Mentioned about several resources, including 1-800-QUIT-NOW.  Smoking cessation information provided to patient. Will continue to monitor.   3. Soft Blood pressure check - acute, stable BP soft 100/70.  Manual recheck was 102/62, asymptomatic with this. Average Bps at home systolic are 120s to 130s.  Given blood pressure log and discussed with him to monitor his blood pressures for the next 1 to 2 weeks and if he notices a trend of soft blood pressures or becomes symptomatic to let us know and we will decrease dose of losartan.  We will check BMET and CBC today to r/o other etiologies for soft blood pressures.   4. Ischemic cardiomyopathy EF low normal at 50-55% by last assessment with hypokinesis of basal to mid inferior wall. Appears euvolemic on exam. Remains on BB and ARB but may need to stop losartan and/or reduce metoprolol dose if soft Bps persist.  5. Disposition: Follow-up in 6 months or sooner if anything changes.      Medication Adjustments/Labs and Tests Ordered: Current medicines are reviewed at length with the patient today.  Concerns regarding medicines are outlined above.  Orders Placed This Encounter  Procedures   CBC   Basic Metabolic Panel (BMET)   EKG 12-Lead   No orders of the defined types were placed in this encounter.   Patient Instructions  Medication Instructions:  Your physician recommends that you continue on your current medications as directed. Please refer  to the Current Medication list given to you today. *If you need a refill on your cardiac medications before your next appointment, please call your  pharmacy*   Lab Work: [facility], BMET If you have labs (blood work) drawn today and your tests are completely normal, you will receive your results only by: MyChart Message (if you have MyChart) OR A paper copy in the mail If you have any lab test that is abnormal or we need to change your treatment, we will call you to review the results.   Testing/Procedures: NONE ORDERED   Follow-Up: At [facility], you and your health needs are our priority.  As part of our continuing mission to provide you with exceptional heart care, we have created designated Provider Care Teams.  These Care Teams include your primary Cardiologist (physician) and Advanced Practice Providers (APPs -  Physician Assistants and Nurse Practitioners) who all work together to provide you with the care you need, when you need it.  We recommend signing up for the patient portal called "MyChart".  Sign up information is provided on this After Visit Summary.  MyChart is used to connect with patients for Virtual Visits (Telemedicine).  Patients are able to view lab/test results, encounter notes, upcoming appointments, etc.  Non-urgent messages can be sent to your provider as well.   To learn more about what you can do with MyChart, go to [email or web address].    Your next appointment:   6 month(s)  The format for your next appointment:   In Person  Provider:   Tonny Bollman, MD or APP    Other Instructions CHECK YOU BLOOD PRESSURE DAILY RECORD BLOOD PRESSURE READINGS IN LOG SALTY 6 HAND OUT GIVEN WORK ON QUITTING SMOKING   Important Information About Sugar         Dia Crawford, NP  10/08/2021 10:47 AM    [facility]

## 2021-10-08 ENCOUNTER — Encounter: Payer: Self-pay | Admitting: Physician Assistant

## 2021-10-08 ENCOUNTER — Ambulatory Visit (INDEPENDENT_AMBULATORY_CARE_PROVIDER_SITE_OTHER): Payer: Medicare Other | Admitting: Nurse Practitioner

## 2021-10-08 VITALS — BP 100/70 | HR 65 | Ht 66.0 in | Wt 192.2 lb

## 2021-10-08 DIAGNOSIS — I213 ST elevation (STEMI) myocardial infarction of unspecified site: Secondary | ICD-10-CM

## 2021-10-08 DIAGNOSIS — I2510 Atherosclerotic heart disease of native coronary artery without angina pectoris: Secondary | ICD-10-CM | POA: Diagnosis not present

## 2021-10-08 DIAGNOSIS — Z0130 Encounter for examination of blood pressure without abnormal findings: Secondary | ICD-10-CM

## 2021-10-08 DIAGNOSIS — Z720 Tobacco use: Secondary | ICD-10-CM | POA: Diagnosis not present

## 2021-10-08 DIAGNOSIS — I255 Ischemic cardiomyopathy: Secondary | ICD-10-CM

## 2021-10-08 DIAGNOSIS — E782 Mixed hyperlipidemia: Secondary | ICD-10-CM | POA: Diagnosis not present

## 2021-10-08 LAB — CBC
Hematocrit: 43.6 % (ref 37.5–51.0)
Hemoglobin: 14.6 g/dL (ref 13.0–17.7)
MCH: 31.9 pg (ref 26.6–33.0)
MCHC: 33.5 g/dL (ref 31.5–35.7)
MCV: 95 fL (ref 79–97)
Platelets: 291 10*3/uL (ref 150–450)
RBC: 4.58 x10E6/uL (ref 4.14–5.80)
RDW: 12.8 % (ref 11.6–15.4)
WBC: 11.6 10*3/uL — ABNORMAL HIGH (ref 3.4–10.8)

## 2021-10-08 LAB — BASIC METABOLIC PANEL
BUN/Creatinine Ratio: 12 (ref 10–24)
BUN: 16 mg/dL (ref 8–27)
CO2: 23 mmol/L (ref 20–29)
Calcium: 10 mg/dL (ref 8.6–10.2)
Chloride: 100 mmol/L (ref 96–106)
Creatinine, Ser: 1.3 mg/dL — ABNORMAL HIGH (ref 0.76–1.27)
Glucose: 102 mg/dL — ABNORMAL HIGH (ref 70–99)
Potassium: 4.4 mmol/L (ref 3.5–5.2)
Sodium: 138 mmol/L (ref 134–144)
eGFR: 59 mL/min/{1.73_m2} — ABNORMAL LOW (ref >59)

## 2021-10-08 NOTE — Patient Instructions (Addendum)
Medication Instructions:  Your physician recommends that you continue on your current medications as directed. Please refer to the Current Medication list given to you today. *If you need a refill on your cardiac medications before your next appointment, please call your pharmacy*   Lab Work: [facility], BMET If you have labs (blood work) drawn today and your tests are completely normal, you will receive your results only by: MyChart Message (if you have MyChart) OR A paper copy in the mail If you have any lab test that is abnormal or we need to change your treatment, we will call you to review the results.   Testing/Procedures: NONE ORDERED   Follow-Up: At [facility], you and your health needs are our priority.  As part of our continuing mission to provide you with exceptional heart care, we have created designated Provider Care Teams.  These Care Teams include your primary Cardiologist (physician) and Advanced Practice Providers (APPs -  Physician Assistants and Nurse Practitioners) who all work together to provide you with the care you need, when you need it.  We recommend signing up for the patient portal called "MyChart".  Sign up information is provided on this After Visit Summary.  MyChart is used to connect with patients for Virtual Visits (Telemedicine).  Patients are able to view lab/test results, encounter notes, upcoming appointments, etc.  Non-urgent messages can be sent to your provider as well.   To learn more about what you can do with MyChart, go to [email or web address].    Your next appointment:   6 month(s)  The format for your next appointment:   In Person  Provider:   Tonny Bollman, MD or APP    Other Instructions CHECK YOU BLOOD PRESSURE DAILY RECORD BLOOD PRESSURE READINGS IN LOG SALTY 6 HAND OUT GIVEN WORK ON QUITTING SMOKING   Important Information About Sugar

## 2021-10-10 ENCOUNTER — Telehealth: Payer: Self-pay

## 2021-10-10 DIAGNOSIS — I255 Ischemic cardiomyopathy: Secondary | ICD-10-CM

## 2021-10-10 DIAGNOSIS — I2510 Atherosclerotic heart disease of native coronary artery without angina pectoris: Secondary | ICD-10-CM

## 2021-10-10 NOTE — Telephone Encounter (Signed)
The patient has been notified of the result and verbalized understanding.  All questions (if any) were answered. Theresia Majors, RN 10/10/2021 8:53 AM   Patient denies any UTI symptoms.  Repeat labs have been scheduled.

## 2021-10-10 NOTE — Telephone Encounter (Signed)
-----   Message from Sharlene Dory, NP sent at 10/10/2021  7:54 AM EDT ----- Regarding: Repeat Lab work Please update the patient about the following regarding his recent blood work:   "Your white blood cell count is mildly elevated, and this was also seen in April of this year. Also, kidney function is elevated since April 2023. We would like to recheck kidney function and another CBC with differential within one week to re-evaluate these values. Please determine if patient has any UTI (burning, frequent urination, etc.).   If he has any concerning symptoms for UTI, place order for UA with Urine culture. If not, we will leave this out and continue with labwork mentioned above. Additionally, please place order for repeat BMET and CBC with differential in 1 week.   Thanks so much!   Sharlene Dory, AGNP-C

## 2021-10-11 ENCOUNTER — Telehealth (HOSPITAL_COMMUNITY): Payer: Self-pay

## 2021-10-11 NOTE — Telephone Encounter (Signed)
Spoke with pt to set a cardiac rehab date for him to start and pt stated that it wouldn't work and that he is yet again no longer interested in the cardiac rehab program. Re-closed pt referral.

## 2021-10-17 ENCOUNTER — Other Ambulatory Visit: Payer: Medicare Other

## 2021-10-17 DIAGNOSIS — I255 Ischemic cardiomyopathy: Secondary | ICD-10-CM

## 2021-10-17 DIAGNOSIS — I2510 Atherosclerotic heart disease of native coronary artery without angina pectoris: Secondary | ICD-10-CM

## 2021-10-18 LAB — CBC WITH DIFFERENTIAL/PLATELET
Basophils Absolute: 0.1 10*3/uL (ref 0.0–0.2)
Basos: 1 %
EOS (ABSOLUTE): 0.3 10*3/uL (ref 0.0–0.4)
Eos: 4 %
Hematocrit: 42.7 % (ref 37.5–51.0)
Hemoglobin: 14.2 g/dL (ref 13.0–17.7)
Immature Grans (Abs): 0 10*3/uL (ref 0.0–0.1)
Immature Granulocytes: 0 %
Lymphocytes Absolute: 2.8 10*3/uL (ref 0.7–3.1)
Lymphs: 31 %
MCH: 31.9 pg (ref 26.6–33.0)
MCHC: 33.3 g/dL (ref 31.5–35.7)
MCV: 96 fL (ref 79–97)
Monocytes Absolute: 0.7 10*3/uL (ref 0.1–0.9)
Monocytes: 8 %
Neutrophils Absolute: 5.1 10*3/uL (ref 1.4–7.0)
Neutrophils: 56 %
Platelets: 264 10*3/uL (ref 150–450)
RBC: 4.45 x10E6/uL (ref 4.14–5.80)
RDW: 12.9 % (ref 11.6–15.4)
WBC: 9.2 10*3/uL (ref 3.4–10.8)

## 2021-10-18 LAB — BASIC METABOLIC PANEL
BUN/Creatinine Ratio: 9 — ABNORMAL LOW (ref 10–24)
BUN: 9 mg/dL (ref 8–27)
CO2: 22 mmol/L (ref 20–29)
Calcium: 9.8 mg/dL (ref 8.6–10.2)
Chloride: 102 mmol/L (ref 96–106)
Creatinine, Ser: 1.05 mg/dL (ref 0.76–1.27)
Glucose: 108 mg/dL — ABNORMAL HIGH (ref 70–99)
Potassium: 4.2 mmol/L (ref 3.5–5.2)
Sodium: 139 mmol/L (ref 134–144)
eGFR: 77 mL/min/{1.73_m2} (ref >59)

## 2021-10-19 ENCOUNTER — Telehealth: Payer: Self-pay | Admitting: Cardiology

## 2021-10-19 NOTE — Telephone Encounter (Signed)
Pt returning a call for lab results by Sharlene Dory, NP

## 2021-10-19 NOTE — Telephone Encounter (Signed)
Sharlene Dory, NP  10/19/2021 10:11 AM EDT     All recent blood work is improved and WNL. Kidney function is back down to normal. Will continue current treatment plan as discussed from last office visit.    Thanks so much!    Sharlene Dory, AGNP-C  The patient has been notified of the result and verbalized understanding.  All questions (if any) were answered. Theresia Majors, RN 10/19/2021 10:44 AM

## 2022-02-14 ENCOUNTER — Other Ambulatory Visit: Payer: Self-pay | Admitting: Cardiology

## 2022-04-29 NOTE — Progress Notes (Unsigned)
Office Visit    Patient Name: Mark Potts Date of Encounter: 04/30/2022  PCP:  [facility]  Cardiologist:  Sherren Mocha, MD  Advanced Practice Provider:  No care team member to display Electrophysiologist:  None   HPI    Mark Potts is a 72 y.o. male with a past medical history significant for CAD, NSTEMI status post DES to LCx and OM in 2015, inferior STEMI status post DES to RCA x 2 and DES to pLAD in 06/2021, mild ICM EF 50 to 55%, obesity, hyperlipidemia, hyperglycemia, tobacco abuse presents today for follow-up visit.  He was seen by cardiology July 02, 2021 for inferior STEMI follow-up.  Was found to be recovering well and had cut back on smoking to half a pack a day from his previous smoking 1 pack a day.  He denies chest pain, dizziness, PND, orthopnea, syncope, edema, melena.  He was having some shortness of breath with exertion that seem to be improving.  Tolerating Brilinta and aspirin.  He was last seen 10/08/2021 for a 68-monthfollow-up visit.  Was doing well from a cardiac perspective.  Denied any acute changes in his health since he was last seen in April.  Only complaint is shortness of breath with bending over which he states is baseline for him.  Unfortunately he has increased his tobacco use and is smoking three fourths of a pack a day.  Does not have any desire to quit at this time.  Denied chest pain, worsening shortness of breath, palpitations, syncope, presyncope, dizziness, PND, orthopnea, swelling, significant weight changes, bleeding, or claudication.  He did not endorse an exercise program and he did eat out frequently at restaurants throughout the week.  He had not started or been contacted by cardiac rehab but has been given the information.  Blood pressures were well-controlled at home.  Tolerating medications.  Today, he states he has been doing well.  No chest pain.  He has had some  shortness of breath but he feels it is due to being overweight.  He plans on increasing his exercise this spring when he gets more.  In March he will have some lab due including a lipid panel.  His LDL and triglycerides are both not at goal.  He remains on Lipitor 80 mg daily.  Discussed potentially adding Zetia for further LDL reduction.  Otherwise, doing well without any CV symptoms at this time.No nitro use.  Reports no chest pain, pressure, or tightness. No edema, orthopnea, PND. Reports no palpitations.   Past Medical History    Past Medical History:  Diagnosis Date   CAD (coronary artery disease)    a. PCI of LCX and OM1 in 2015. b. s/p DES to RCA x 2 & DES to pLAD 06/2021   Hyperlipemia    NSTEMI (non-ST elevated myocardial infarction) ([facility]    Past Surgical History:  Procedure Laterality Date   CORONARY ANGIOPLASTY WITH STENT PLACEMENT  06/21/13   PCI to mid LCX and OM1 with resolute DES   CORONARY STENT INTERVENTION N/A 06/18/2021   Procedure: CORONARY STENT INTERVENTION;  Surgeon: CSherren Mocha MD;  [facility];  Service: Cardiovascular;  Laterality: N/A;   CORONARY/GRAFT ACUTE MI REVASCULARIZATION N/A 06/17/2021   Procedure: Coronary/Graft Acute MI Revascularization;  Surgeon: CSherren Mocha MD;  [facility];  Service: Cardiovascular;  Laterality: N/A;   LEFT HEART CATH AND CORONARY ANGIOGRAPHY N/A 06/17/2021  Procedure: LEFT HEART CATH AND CORONARY ANGIOGRAPHY;  Surgeon: Sherren Mocha, MD;  Location: [place] [facility];  Service: Cardiovascular;  Laterality: N/A;   LEFT HEART CATHETERIZATION WITH CORONARY ANGIOGRAM N/A 06/21/2013   Procedure: LEFT HEART CATHETERIZATION WITH CORONARY ANGIOGRAM;  Surgeon: Jettie Booze, MD;  Location[facility] C[facility] LAB;  Service: Cardiovascular;  Laterality: N/A;    Allergies  No Known Allergies   EKGs/Labs/Other Studies Reviewed:   The following studies were reviewed today:  2D echo on June 18, 2021: LVEF is 50 to 55%.  Left ventricle has low normal function.  Wall motion difficult to assess due to incomplete visualization of LV endocardium.  Based on limited views, the basal to mid inferior LV wall is hypokinetic.  The rest of the LV segments demonstrate low normal contractility.  There is moderate hypertrophy of the basal septal segment.  The rest of the LV segments demonstrate mild left ventricular hypertrophy.  The left ventricular diastolic parameters are consistent with grade 1 diastolic dysfunction (impaired relaxation). 2.  Right ventricular systolic function is normal.  The right ventricular size is not well visualized.  Tricuspid regurgitation signal is not adequate for assessing PA pressure. 3.  The mitral valve is grossly normal.  Trivial mitral valve regurgitation.  No evidence of mitral stenosis. 4.  The aortic valve was not well visualized. Aortic valve regurgitation is not visualized.  Aortic valve sclerosis/calcification is present, without any evidence of aortic stenosis. 5.  The inferior vena cava is normal in size with greater than 50% respiratory variability, suggesting right atrial pressure of 3 mmHg.   Coronary stent intervention on June 18, 2021: Successful staged PCI of severe stenosis of the proximal/mid LAD at the level of a small first diagonal branch, 90% stenosis reduced to 0% with a 2.5 x 16 mm Synergy DES postdilated to high-pressure with a 2.75 mm noncompliant balloon. Recommend: Transfer to telemetry, anticipate hospital discharge tomorrow, dual antiplatelet therapy with aspirin and Brilinta x12 months without interruption (ACS class I guideline recommendation).     Left heart cath and coronary angiography on June 17, 2021:   Prox RCA lesion is 80% stenosed.   Dist RCA lesion is 99% stenosed.   Prox Cx to Dist Cx lesion is 25% stenosed.   Prox LAD lesion is 90% stenosed.   A drug-eluting stent was successfully placed using a STENT ONYX FRONTIER 3.5X18.    A drug-eluting stent was successfully placed using a STENT ONYX FRONTIER 2.5X30.   Post intervention, there is a 0% residual stenosis.   Post intervention, there is a 0% residual stenosis.   1.  Acute inferior STEMI secondary to subtotal occlusion of the distal RCA, 2 RCA lesions treated successfully with PCI using a 2.5 x 30 mm Onyx frontier DES in the distal lesion and a 3.5 x 18 mm Onyx frontier DES in the proximal lesion 2.  Continued patency of the stented segment in the left circumflex with mild diffuse in-stent restenosis but no obstructive disease 3.  Severe mid LAD stenosis at the origin of the first diagonal 4.  Mild LV systolic dysfunction with basal mid inferior hypokinesis, LVEF estimated at 50 to 55% consistent with his acute inferior STEMI   Recommendations:  Aspirin and ticagrelor x12 months without interruption Tobacco cessation counseling Post MI medical therapy, check 2D echo Staged PCI of the LAD to treat severe 90% stenosis and a focal area at the origin of a small first diagonal branch  EKG:  EKG is not  ordered today.   Recent Labs: 06/19/2021: ALT 33 10/17/2021: BUN 9; Creatinine, Ser 1.05; Hemoglobin 14.2; Platelets 264; Potassium 4.2; Sodium 139  Recent Lipid Panel    Component Value Date/Time   CHOL 222 (H) 06/17/2021 1324   TRIG 227 (H) 06/17/2021 1324   HDL 43 06/17/2021 1324   CHOLHDL 5.2 06/17/2021 1324   VLDL 45 (H) 06/17/2021 1324   LDLCALC 134 (H) 06/17/2021 1324    Home Medications   Current Meds  Medication Sig   acetaminophen (TYLENOL) 325 MG tablet Take 650 mg by mouth every 6 (six) hours as needed for mild pain or headache.   aspirin 81 MG EC tablet Take 1 tablet (81 mg total) by mouth daily. Swallow whole.   atorvastatin (LIPITOR) 80 MG tablet TAKE 1 TABLET BY MOUTH EVERYDAY AT BEDTIME   losartan (COZAAR) 25 MG tablet Take 1/2 tablet (12.5 mg total) by mouth daily.   Menthol, Topical Analgesic, (BENGAY ULTRA STRENGTH EX) Apply topically as  needed.   metoprolol tartrate (LOPRESSOR) 25 MG tablet Take 1 tablet (25 mg total) by mouth 2 (two) times daily.   nitroGLYCERIN (NITROSTAT) 0.4 MG SL tablet Place 1 tablet (0.4 mg total) under the tongue every 5 (five) minutes as needed for chest pain.   SALINE NASAL SPRAY NA Place into the nose as needed. Deep Sea nasal spray taking as needed   ticagrelor (BRILINTA) 90 MG TABS tablet Take 1 tablet (90 mg total) by mouth 2 (two) times daily.     Review of Systems      All other systems reviewed and are otherwise negative except as noted above.  Physical Exam    VS:  BP 109/62   Pulse 82   Ht 5' 6"$  (1.676 m)   Wt 190 lb 6.4 oz (86.4 kg)   SpO2 97%   BMI 30.73 kg/m  , BMI Body mass index is 30.73 kg/m.  Wt Readings from Last 3 Encounters:  04/30/22 190 lb 6.4 oz (86.4 kg)  10/08/21 192 lb 3.2 oz (87.2 kg)  07/02/21 196 lb 9.6 oz (89.2 kg)     GEN: Well nourished, well developed, in no acute distress. HEENT: normal. Neck: Supple, no JVD, carotid bruits, or masses. Cardiac: RRR, no murmurs, rubs, or gallops. No clubbing, cyanosis, edema.  Radials/PT 2+ and equal bilaterally.  Respiratory:  Respirations regular and unlabored, clear to auscultation bilaterally. GI: Soft, nontender, nondistended. MS: No deformity or atrophy. Skin: Warm and dry, no rash. Neuro:  Strength and sensation are intact. Psych: Normal affect.  Assessment & Plan    CAD status post STEMI with PCI 06/2021 -LDL goal less than 70 -triglycerides 165, less than 150 is the goal -labs repeated in a few weeks with his PCP -Continue heart healthy diet and increase exercise -Anginal equivalent is shoulder pain, he has not had this since his stent -Continue GDMT with ASA 81 mg daily, Lipitor 80 mg daily, Cozaar 12.5 mg daily, Lopressor 25 mg twice daily, nitro as needed, Brilinta 90 mg twice a day  Ischemic cardiomyopathy -no swelling in legs, weight stable -no scale at home to do daily weights -appears  euvolemic on exam today -no bleeding on brilinta, continue until 06/2022  Mixed hyperlipidemia -Last LDL 135, goal 70 -Triglycerides are also above goal at 165 -Recommend 3 lipid panel when he sees his primary in a month  Tobacco abuse -Continues to smoke at this time.  Cessation advised.  Hypertension -Blood pressure well-controlled at this time.  Continue losartan  12.5 mg daily and metoprolol 25 mg twice daily         Disposition: Follow up 6 months with Sherren Mocha, MD or APP.  Signed, Elgie Collard, PA-C 04/30/2022, 1:39 PM  [facility]

## 2022-04-30 ENCOUNTER — Ambulatory Visit: Payer: 59 | Attending: Physician Assistant | Admitting: Physician Assistant

## 2022-04-30 ENCOUNTER — Encounter: Payer: Self-pay | Admitting: Physician Assistant

## 2022-04-30 VITALS — BP 109/62 | HR 82 | Ht 66.0 in | Wt 190.4 lb

## 2022-04-30 DIAGNOSIS — I10 Essential (primary) hypertension: Secondary | ICD-10-CM

## 2022-04-30 DIAGNOSIS — I2510 Atherosclerotic heart disease of native coronary artery without angina pectoris: Secondary | ICD-10-CM | POA: Diagnosis not present

## 2022-04-30 DIAGNOSIS — I2119 ST elevation (STEMI) myocardial infarction involving other coronary artery of inferior wall: Secondary | ICD-10-CM

## 2022-04-30 DIAGNOSIS — E785 Hyperlipidemia, unspecified: Secondary | ICD-10-CM | POA: Diagnosis not present

## 2022-04-30 DIAGNOSIS — Z720 Tobacco use: Secondary | ICD-10-CM

## 2022-04-30 NOTE — Patient Instructions (Signed)
Medication Instructions:  Your physician recommends that you continue on your current medications as directed. Please refer to the Current Medication list given to you today.  *If you need a refill on your cardiac medications before your next appointment, please call your pharmacy*   Lab Work: Labs with primary care when you see them in a few weeks, have them fax results [phone] If you have labs (blood work) drawn today and your tests are completely normal, you will receive your results only by: [place] (if you have MyChart) OR A paper copy in the mail If you have any lab test that is abnormal or we need to change your treatment, we will call you to review the results.  Follow-Up: At [facility], you and your health needs are our priority.  As part of our continuing mission to provide you with exceptional heart care, we have created designated Provider Care Teams.  These Care Teams include your primary Cardiologist (physician) and Advanced Practice Providers (APPs -  Physician Assistants and Nurse Practitioners) who all work together to provide you with the care you need, when you need it.  We recommend signing up for the patient portal called "MyChart".  Sign up information is provided on this After Visit Summary.  MyChart is used to connect with patients for Virtual Visits (Telemedicine).  Patients are able to view lab/test results, encounter notes, upcoming appointments, etc.  Non-urgent messages can be sent to your provider as well.   To learn more about what you can do with MyChart, go to [email or web address].    Your next appointment:   6 month(s)  Provider:   Sherren Mocha, MD

## 2022-10-31 ENCOUNTER — Ambulatory Visit: Payer: 59 | Attending: Cardiovascular Disease | Admitting: Cardiovascular Disease

## 2022-10-31 ENCOUNTER — Encounter: Payer: Self-pay | Admitting: Cardiovascular Disease

## 2022-10-31 NOTE — Progress Notes (Deleted)
Cardiology Office Note:    Date:  10/31/2022   ID:  Mark Potts, DOB Nov 20, 1950, MRN [id number]  PCP:  [facility] Providers Cardiologist:  Tonny Bollman, MD     Referring MD: Roe Coombs*   No chief complaint on file.   History of Present Illness:    Mark Potts is a 72 y.o. male with a past medical history significant for CAD, NSTEMI status post DES to LCx and OM in 2015, inferior STEMI status post DES to RCA x 2 and DES to pLAD in 06/2021, mild ICM EF 50 to 55%, obesity, hyperlipidemia, hyperglycemia, tobacco abuse presents today for follow-up visit.   Past Medical History:  Diagnosis Date   CAD (coronary artery disease)    a. PCI of LCX and OM1 in 2015. b. s/p DES to RCA x 2 & DES to pLAD 06/2021   Hyperlipemia    NSTEMI (non-ST elevated myocardial infarction) [facility])     Past Surgical History:  Procedure Laterality Date   CORONARY ANGIOPLASTY WITH STENT PLACEMENT  06/21/13   PCI to mid LCX and OM1 with resolute DES   CORONARY STENT INTERVENTION N/A 06/18/2021   Procedure: CORONARY STENT INTERVENTION;  Surgeon: Tonny Bollman, MD;  Location: [facility] INVASIVE CV LAB;  Service: Cardiovascular;  Laterality: N/A;   CORONARY/GRAFT ACUTE MI REVASCULARIZATION N/A 06/17/2021   Procedure: Coronary/Graft Acute MI Revascularization;  Surgeon: Tonny Bollman, MD;  Location: [facility] INVASIVE CV LAB;  Service: Cardiovascular;  Laterality: N/A;   LEFT HEART CATH AND CORONARY ANGIOGRAPHY N/A 06/17/2021   Procedure: LEFT HEART CATH AND CORONARY ANGIOGRAPHY;  Surgeon: Tonny Bollman, MD;  Location: [facility] INVASIVE CV LAB;  Service: Cardiovascular;  Laterality: N/A;   LEFT HEART CATHETERIZATION WITH CORONARY ANGIOGRAM N/A 06/21/2013   Procedure: LEFT HEART CATHETERIZATION WITH CORONARY ANGIOGRAM;  Surgeon: Corky Crafts, MD;  Location: [facility] CATH LAB;  Service: Cardiovascular;  Laterality: N/A;    Current Medications: No outpatient  medications have been marked as taking for the 10/31/22 encounter (Appointment) with Tonny Bollman, MD.     Allergies:   Patient has no known allergies.   Social History   Socioeconomic History   Marital status: Divorced    Spouse name: Not on file   Number of children: 2   Years of education: Not on file   Highest education level: Not on file  Occupational History    Employer: [employer]  Tobacco Use   Smoking status: Every Day    Current packs/day: 1.00    Types: Cigarettes   Smokeless tobacco: Never  Substance and Sexual Activity   Alcohol use: No    Alcohol/week: 0.0 standard drinks of alcohol   Drug use: No   Sexual activity: Not on file  Other Topics Concern   Not on file  Social History Narrative   Not on file   Social Determinants of Health   Financial Resource Strain: Not on file  Food Insecurity: Not on file  Transportation Needs: Not on file  Physical Activity: Not on file  Stress: Not on file  Social Connections: Not on file     Family History: The patient's family history includes Cancer in his brother; Heart disease in an other family member.  ROS:   Please see the history of present illness.    All other systems reviewed and are negative.  EKGs/Labs/Other Studies Reviewed:    The following studies were reviewed today: Echo 06/18/2021:  1. Left ventricular ejection  fraction, by estimation, is 50 to 55%. The  left ventricle has low normal function. Wall motion difficult to assess  due to incomplete visualization of LV endocardium. Based on limited views,  the basal-to-mid inferior LV wall  is hypokinetic. The rest of the LV segments demonstrate low normal  contractility. There is moderate hypertrophy of the basal septal segment.  The rest of the LV segments demonstrate mild left ventricular hypertrophy.  Left ventricular diastolic parameters  are consistent with Grade I diastolic dysfunction (impaired relaxation).   2. Right  ventricular systolic function is normal. The right ventricular  size is not well visualized. Tricuspid regurgitation signal is inadequate  for assessing PA pressure.   3. The mitral valve is grossly normal. Trivial mitral valve  regurgitation. No evidence of mitral stenosis.   4. The aortic valve was not well visualized. Aortic valve regurgitation  is not visualized. Aortic valve sclerosis/calcification is present,  without any evidence of aortic stenosis.   5. The inferior vena cava is normal in size with greater than 50%  respiratory variability, suggesting right atrial pressure of 3 mmHg.   Comparison(s): No prior Echocardiogram.        Recent Labs: No results found for requested labs within last 365 days.  Recent Lipid Panel    Component Value Date/Time   CHOL 222 (H) 06/17/2021 1324   TRIG 227 (H) 06/17/2021 1324   HDL 43 06/17/2021 1324   CHOLHDL 5.2 06/17/2021 1324   VLDL 45 (H) 06/17/2021 1324   LDLCALC 134 (H) 06/17/2021 1324     Risk Assessment/Calculations:   {Does this patient have ATRIAL FIBRILLATION?:[id number]}  No BP recorded.  {Refresh Note OR Click here to enter BP  :1}***         Physical Exam:    VS:  There were no vitals taken for this visit.    Wt Readings from Last 3 Encounters:  04/30/22 190 lb 6.4 oz (86.4 kg)  10/08/21 192 lb 3.2 oz (87.2 kg)  07/02/21 196 lb 9.6 oz (89.2 kg)     GEN: *** Well nourished, well developed in no acute distress HEENT: Normal NECK: No JVD; No carotid bruits LYMPHATICS: No lymphadenopathy CARDIAC: ***RRR, no murmurs, rubs, gallops RESPIRATORY:  Clear to auscultation without rales, wheezing or rhonchi  ABDOMEN: Soft, non-tender, non-distended MUSCULOSKELETAL:  No edema; No deformity  SKIN: Warm and dry NEUROLOGIC:  Alert and oriented x 3 PSYCHIATRIC:  Normal affect   ASSESSMENT:    1. Coronary artery disease involving native coronary artery of native heart without angina pectoris   2. Essential  hypertension   3. Hyperlipidemia LDL goal <70   4. Tobacco abuse   5. Ischemic cardiomyopathy    PLAN:    In order of problems listed above:  ***      {Are you ordering a CV Procedure (e.g. stress test, cath, DCCV, TEE, etc)?   Press F2        :[id number]}    Medication Adjustments/Labs and Tests Ordered: Current medicines are reviewed at length with the patient today.  Concerns regarding medicines are outlined above.  No orders of the defined types were placed in this encounter.  No orders of the defined types were placed in this encounter.   There are no Patient Instructions on file for this visit.   Signed, Tonny Bollman, MD  10/31/2022 1:05 PM    [facility]

## 2022-12-12 ENCOUNTER — Encounter: Payer: Self-pay | Admitting: Cardiovascular Disease

## 2022-12-12 ENCOUNTER — Ambulatory Visit: Payer: 59 | Attending: Cardiovascular Disease | Admitting: Cardiovascular Disease

## 2022-12-12 VITALS — BP 140/80 | HR 103 | Ht 66.0 in | Wt 186.4 lb

## 2022-12-12 DIAGNOSIS — I10 Essential (primary) hypertension: Secondary | ICD-10-CM | POA: Diagnosis not present

## 2022-12-12 DIAGNOSIS — I2510 Atherosclerotic heart disease of native coronary artery without angina pectoris: Secondary | ICD-10-CM | POA: Diagnosis not present

## 2022-12-12 DIAGNOSIS — Z09 Encounter for follow-up examination after completed treatment for conditions other than malignant neoplasm: Secondary | ICD-10-CM | POA: Diagnosis not present

## 2022-12-12 DIAGNOSIS — Z720 Tobacco use: Secondary | ICD-10-CM | POA: Diagnosis not present

## 2022-12-12 DIAGNOSIS — E782 Mixed hyperlipidemia: Secondary | ICD-10-CM

## 2022-12-12 MED ORDER — LOSARTAN POTASSIUM 25 MG PO TABS: 25.0000 mg | ORAL_TABLET | Freq: Every day | ORAL | 3 refills | Status: AC

## 2022-12-12 NOTE — Patient Instructions (Signed)
Medication Instructions:  INCREASE Losartan to 25mg  daily STOP Brilinta *If you need a refill on your cardiac medications before your next appointment, please call your pharmacy*   Lab Work: NONE If you have labs (blood work) drawn today and your tests are completely normal, you will receive your results only by: MyChart Message (if you have MyChart) OR A paper copy in the mail If you have any lab test that is abnormal or we need to change your treatment, we will call you to review the results.   Testing/Procedures: NONE   Follow-Up: At [facility], you and your health needs are our priority.  As part of our continuing mission to provide you with exceptional heart care, we have created designated Provider Care Teams.  These Care Teams include your primary Cardiologist (physician) and Advanced Practice Providers (APPs -  Physician Assistants and Nurse Practitioners) who all work together to provide you with the care you need, when you need it.  We recommend signing up for the patient portal called "MyChart".  Sign up information is provided on this After Visit Summary.  MyChart is used to connect with patients for Virtual Visits (Telemedicine).  Patients are able to view lab/test results, encounter notes, upcoming appointments, etc.  Non-urgent messages can be sent to your provider as well.   To learn more about what you can do with MyChart, go to [email or web address].    Your next appointment:   6 month(s)  Provider:   Jari Favre, PA-C, Ronie Spies, PA-C, Robin Searing, NP, Eligha Bridegroom, NP, Tereso Newcomer, PA-C, or Perlie Gold, PA-C     Then, Tonny Bollman, MD will plan to see you again in 1 year(s).

## 2022-12-12 NOTE — Progress Notes (Signed)
Cardiology Office Note:    Date:  12/12/2022   ID:  Mark Potts, DOB [birth year], MRN [id number]  PCP:  [facility]   [facility] Providers Cardiologist:  Tonny Bollman, MD     Referring MD: Roe Coombs*   Chief Complaint  Patient presents with   Coronary Artery Disease    History of Present Illness:    Mark Potts is a 72 y.o. male presenting for follow-up of coronary artery disease.  Patient initially underwent stenting of the circumflex in 2015 after presenting with non-STEMI.  He had an inferior STEMI in 2023 and was treated with primary PCI with drug-eluting stent placement in the RCA followed by staged PCI of severe stenosis in the LAD.  LVEF at that time was 50 to 55%.  Comorbid conditions include obesity, hyperlipidemia, and tobacco abuse.  The patient is here alone today.  States that he has been getting along pretty well.  He just had labs drawn yesterday at his primary care physician's office.  I reviewed those through the EMR and found that his hemoglobin A1c is 6.4, LDL cholesterol 41.  Kidney and liver function is normal.  He denies chest pain, chest pressure, edema, claudication symptoms, orthopnea, or PND.  He is had no heart palpitations.  Continues to smoke about 1 pack of cigarettes daily.  Past Medical History:  Diagnosis Date   CAD (coronary artery disease)    a. PCI of LCX and OM1 in 2015. b. s/p DES to RCA x 2 & DES to pLAD 06/2021   Hyperlipemia    NSTEMI (non-ST elevated myocardial infarction) [facility])     Past Surgical History:  Procedure Laterality Date   CORONARY ANGIOPLASTY WITH STENT PLACEMENT  06/21/13   PCI to mid LCX and OM1 with resolute DES   CORONARY STENT INTERVENTION N/A 06/18/2021   Procedure: CORONARY STENT INTERVENTION;  Surgeon: Tonny Bollman, MD;  Location: [facility] INVASIVE CV LAB;  Service: Cardiovascular;  Laterality: N/A;   CORONARY/GRAFT ACUTE MI REVASCULARIZATION N/A 06/17/2021    Procedure: Coronary/Graft Acute MI Revascularization;  Surgeon: Tonny Bollman, MD;  Location: [facility] INVASIVE CV LAB;  Service: Cardiovascular;  Laterality: N/A;   LEFT HEART CATH AND CORONARY ANGIOGRAPHY N/A 06/17/2021   Procedure: LEFT HEART CATH AND CORONARY ANGIOGRAPHY;  Surgeon: Tonny Bollman, MD;  Location: [facility] INVASIVE CV LAB;  Service: Cardiovascular;  Laterality: N/A;   LEFT HEART CATHETERIZATION WITH CORONARY ANGIOGRAM N/A 06/21/2013   Procedure: LEFT HEART CATHETERIZATION WITH CORONARY ANGIOGRAM;  Surgeon: Corky Crafts, MD;  Location: [facility] CATH LAB;  Service: Cardiovascular;  Laterality: N/A;    Current Medications: Current Meds  Medication Sig   acetaminophen (TYLENOL) 325 MG tablet Take 650 mg by mouth every 6 (six) hours as needed for mild pain or headache.   aspirin 81 MG EC tablet Take 1 tablet (81 mg total) by mouth daily. Swallow whole.   atorvastatin (LIPITOR) 80 MG tablet TAKE 1 TABLET BY MOUTH EVERYDAY AT BEDTIME   losartan (COZAAR) 25 MG tablet Take 1 tablet (25 mg total) by mouth daily.   Menthol, Topical Analgesic, (BENGAY ULTRA STRENGTH EX) Apply topically as needed.   metoprolol tartrate (LOPRESSOR) 25 MG tablet Take 1 tablet (25 mg total) by mouth 2 (two) times daily.   nitroGLYCERIN (NITROSTAT) 0.4 MG SL tablet Place 1 tablet (0.4 mg total) under the tongue every 5 (five) minutes as needed for chest pain.   [DISCONTINUED] losartan (COZAAR) 25 MG tablet Take 1/2 tablet (12.5 mg total)  by mouth daily.   [DISCONTINUED] ticagrelor (BRILINTA) 90 MG TABS tablet Take 1 tablet (90 mg total) by mouth 2 (two) times daily.     Allergies:   Patient has no known allergies.   Social History   Socioeconomic History   Marital status: Divorced    Spouse name: Not on file   Number of children: 2   Years of education: Not on file   Highest education level: Not on file  Occupational History    Employer: [employer]  Tobacco Use   Smoking status: Every Day     Current packs/day: 1.00    Types: Cigarettes   Smokeless tobacco: Never  Substance and Sexual Activity   Alcohol use: No    Alcohol/week: 0.0 standard drinks of alcohol   Drug use: No   Sexual activity: Not on file  Other Topics Concern   Not on file  Social History Narrative   Not on file   Social Determinants of Health   Financial Resource Strain: Not on file  Food Insecurity: Not on file  Transportation Needs: Not on file  Physical Activity: Not on file  Stress: Not on file  Social Connections: Not on file     Family History: The patient's family history includes Cancer in his brother; Heart disease in an other family member.  ROS:   Please see the history of present illness.    All other systems reviewed and are negative.  EKGs/Labs/Other Studies Reviewed:    The following studies were reviewed today: Echo 06/2021:  1. Left ventricular ejection fraction, by estimation, is 50 to 55%. The  left ventricle has low normal function. Wall motion difficult to assess  due to incomplete visualization of LV endocardium. Based on limited views,  the basal-to-mid inferior LV wall  is hypokinetic. The rest of the LV segments demonstrate low normal  contractility. There is moderate hypertrophy of the basal septal segment.  The rest of the LV segments demonstrate mild left ventricular hypertrophy.  Left ventricular diastolic parameters  are consistent with Grade I diastolic dysfunction (impaired relaxation).   2. Right ventricular systolic function is normal. The right ventricular  size is not well visualized. Tricuspid regurgitation signal is inadequate  for assessing PA pressure.   3. The mitral valve is grossly normal. Trivial mitral valve  regurgitation. No evidence of mitral stenosis.   4. The aortic valve was not well visualized. Aortic valve regurgitation  is not visualized. Aortic valve sclerosis/calcification is present,  without any evidence of aortic stenosis.   5.  The inferior vena cava is normal in size with greater than 50%  respiratory variability, suggesting right atrial pressure of 3 mmHg.   Comparison(s): No prior Echocardiogram.  EKG Interpretation Date/Time:  Thursday December 12 2022 14:24:52 EDT Ventricular Rate:  104 PR Interval:  238 QRS Duration:  94 QT Interval:  326 QTC Calculation: 428 R Axis:   96  Text Interpretation: Sinus tachycardia with 1st degree A-V block Inferior-posterior infarct , age undetermined When compared with ECG of 19-Jun-2021 06:56, SINCE LAST TRACING HEART RATE HAS INCREASED PR interval has increased QRS axis Shifted right Inferior-posterior infarct is now Present T wave inversion less evident in Inferior leads Confirmed by Tonny Bollman [phone]) on 12/12/2022 2:33:45 PM    Recent Labs: No results found for requested labs within last 365 days.  Recent Lipid Panel    Component Value Date/Time   CHOL 222 (H) 06/17/2021 1324   TRIG 227 (H) 06/17/2021 1324  HDL 43 06/17/2021 1324   CHOLHDL 5.2 06/17/2021 1324   VLDL 45 (H) 06/17/2021 1324   LDLCALC 134 (H) 06/17/2021 1324          Physical Exam:    VS:  BP (!) 140/80   Pulse (!) 103   Ht 5\' 6"  (1.676 m)   Wt 186 lb 6.4 oz (84.6 kg)   SpO2 97%   BMI 30.09 kg/m     Wt Readings from Last 3 Encounters:  12/12/22 186 lb 6.4 oz (84.6 kg)  04/30/22 190 lb 6.4 oz (86.4 kg)  10/08/21 192 lb 3.2 oz (87.2 kg)     GEN:  Well nourished, well developed in no acute distress HEENT: Normal NECK: No JVD; No carotid bruits LYMPHATICS: No lymphadenopathy CARDIAC: RRR, no murmurs, rubs, gallops RESPIRATORY:  Clear to auscultation without rales, wheezing or rhonchi  ABDOMEN: Soft, non-tender, non-distended MUSCULOSKELETAL:  No edema; No deformity  SKIN: Warm and dry NEUROLOGIC:  Alert and oriented x 3 PSYCHIATRIC:  Normal affect   ASSESSMENT:    1. Follow-up exam   2. Coronary artery disease involving native coronary artery of native heart without angina  pectoris   3. Tobacco abuse   4. Essential hypertension   5. Mixed hyperlipidemia    PLAN:    In order of problems listed above:  Labs reviewed, recommend APP follow-up in 6 months and I will see her back in 1 year unless problems arise in the interim. No anginal symptoms on aspirin for antiplatelet therapy as well as a high intensity statin drug.  Patient is 18 months out from PCI.  He can stop ticagrelor per guideline recommendations and continue aspirin 81 mg daily. Cessation counseling done.  He does not seem ready to quit.  States that he is under a lot of stress at home. Blood pressure is controlled.  Will increase losartan to 25 mg daily.  Continue current dose of metoprolol. Lipids at goal with LDL less than 55.  Continue atorvastatin 80 mg daily.     Medication Adjustments/Labs and Tests Ordered: Current medicines are reviewed at length with the patient today.  Concerns regarding medicines are outlined above.  Orders Placed This Encounter  Procedures   EKG 12-Lead   Meds ordered this encounter  Medications   losartan (COZAAR) 25 MG tablet    Sig: Take 1 tablet (25 mg total) by mouth daily.    Dispense:  90 tablet    Refill:  3    Dose INCREASE    Patient Instructions  Medication Instructions:  INCREASE Losartan to 25mg  daily STOP Brilinta *If you need a refill on your cardiac medications before your next appointment, please call your pharmacy*   Lab Work: NONE If you have labs (blood work) drawn today and your tests are completely normal, you will receive your results only by: MyChart Message (if you have MyChart) OR A paper copy in the mail If you have any lab test that is abnormal or we need to change your treatment, we will call you to review the results.   Testing/Procedures: NONE   Follow-Up: At [facility], you and your health needs are our priority.  As part of our continuing mission to provide you with exceptional heart care, we have  created designated Provider Care Teams.  These Care Teams include your primary Cardiologist (physician) and Advanced Practice Providers (APPs -  Physician Assistants and Nurse Practitioners) who all work together to provide you with the care you need, when  you need it.  We recommend signing up for the patient portal called "MyChart".  Sign up information is provided on this After Visit Summary.  MyChart is used to connect with patients for Virtual Visits (Telemedicine).  Patients are able to view lab/test results, encounter notes, upcoming appointments, etc.  Non-urgent messages can be sent to your provider as well.   To learn more about what you can do with MyChart, go to [email or web address].    Your next appointment:   6 month(s)  Provider:   Jari Favre, PA-C, Ronie Spies, PA-C, Robin Searing, NP, Eligha Bridegroom, NP, Tereso Newcomer, PA-C, or Perlie Gold, PA-C     Then, Tonny Bollman, MD will plan to see you again in 1 year(s).       Signed, Tonny Bollman, MD  12/12/2022 3:25 PM     [facility]

## 2023-03-17 ENCOUNTER — Ambulatory Visit: Payer: 59 | Admitting: Cardiovascular Disease

## 2023-10-25 IMAGING — DX DG CHEST 1V PORT
1 series · 1 of 1 positions shown · non-contrast
Comparison: 06/19/2013

CLINICAL DATA: Chest pain.

EXAM:
PORTABLE CHEST 1 VIEW

[chest ap]
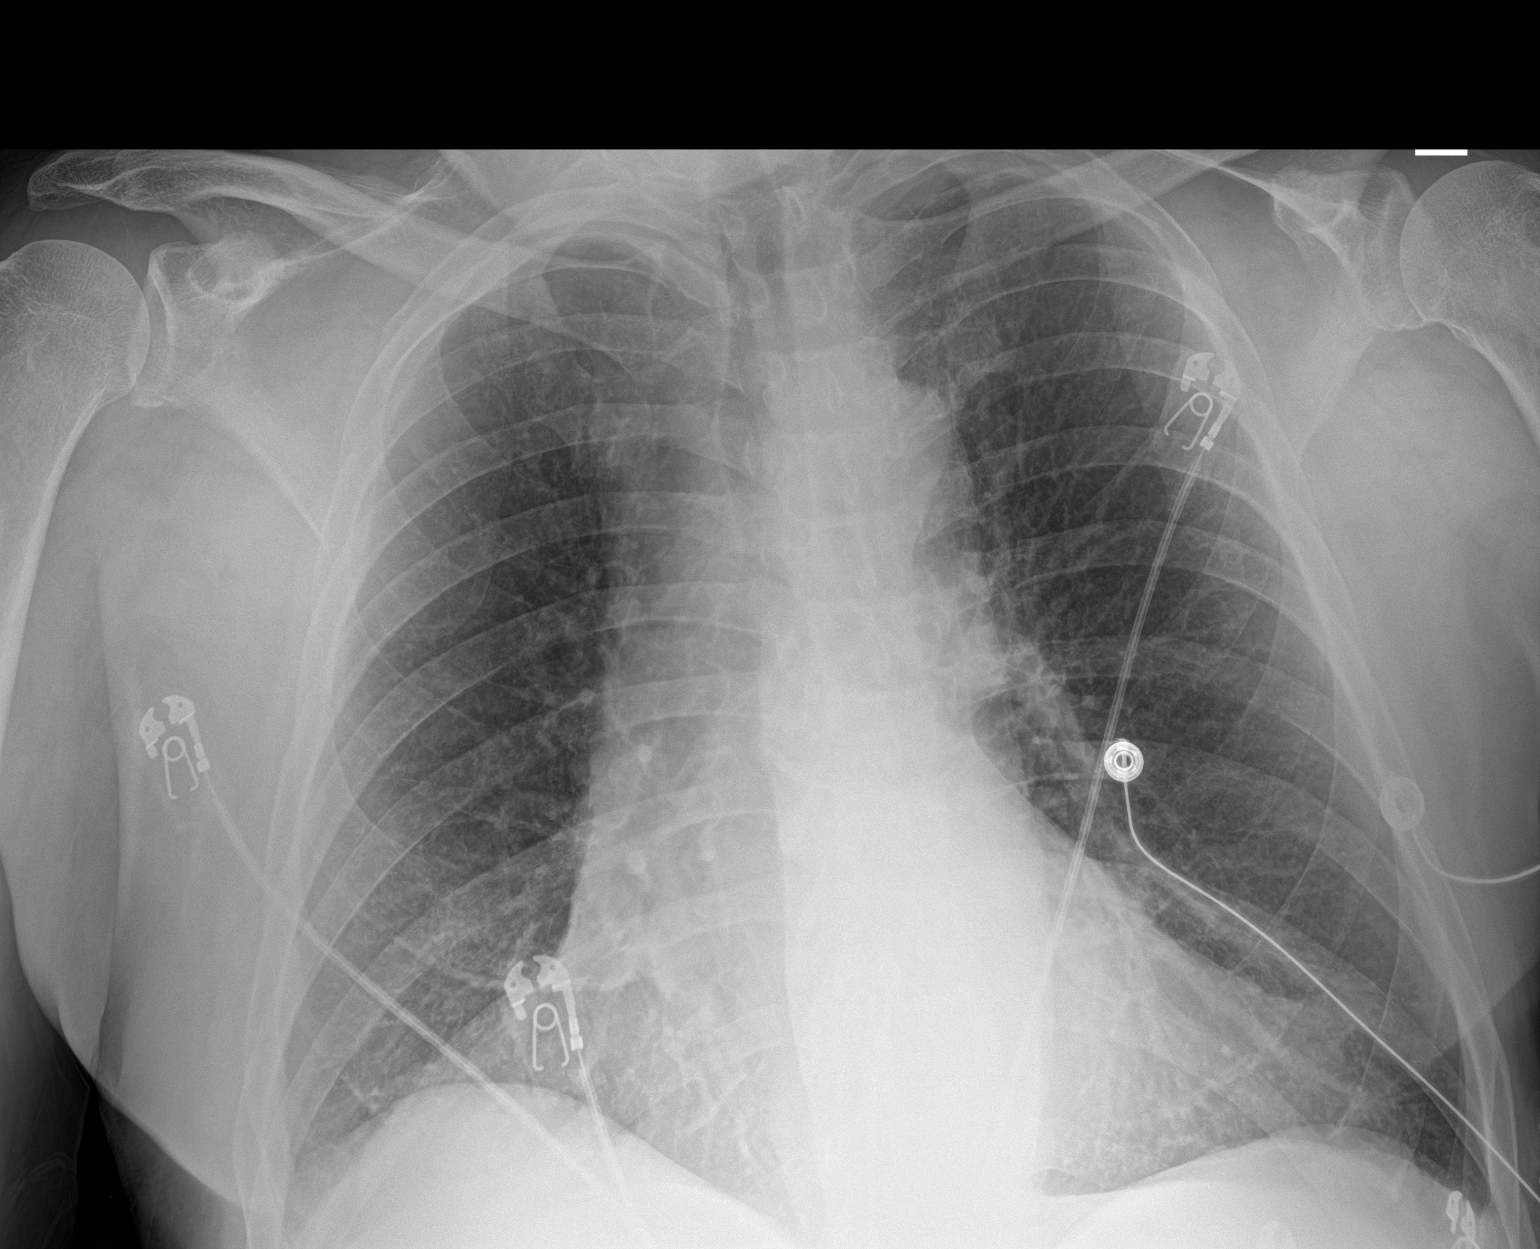

[1 of 1 positions shown; findings below may reference images not displayed]

FINDINGS: Cardiac silhouette is normal in size. No mediastinal or hilar
masses.

Clear lungs.  No convincing pleural effusion.  No pneumothorax.

Skeletal structures are grossly intact.
IMPRESSION: No active disease.
